# Patient Record
Sex: Female | Born: 1955
Health system: Southern US, Community
[De-identification: ages and names within clinical notes are randomized; demographics above are authoritative.]

## PROBLEM LIST (undated history)

## (undated) DIAGNOSIS — T7840XA Allergy, unspecified, initial encounter: Secondary | ICD-10-CM

## (undated) DIAGNOSIS — F32A Depression, unspecified: Secondary | ICD-10-CM

## (undated) DIAGNOSIS — M1990 Unspecified osteoarthritis, unspecified site: Secondary | ICD-10-CM

## (undated) DIAGNOSIS — F419 Anxiety disorder, unspecified: Secondary | ICD-10-CM

## (undated) DIAGNOSIS — E7800 Pure hypercholesterolemia, unspecified: Secondary | ICD-10-CM

## (undated) DIAGNOSIS — D649 Anemia, unspecified: Secondary | ICD-10-CM

## (undated) DIAGNOSIS — H905 Unspecified sensorineural hearing loss: Secondary | ICD-10-CM

## (undated) DIAGNOSIS — K219 Gastro-esophageal reflux disease without esophagitis: Secondary | ICD-10-CM

## (undated) DIAGNOSIS — E039 Hypothyroidism, unspecified: Secondary | ICD-10-CM

## (undated) DIAGNOSIS — R7303 Prediabetes: Secondary | ICD-10-CM

## (undated) HISTORY — PX: CHOLECYSTECTOMY: SHX55

## (undated) HISTORY — DX: Anemia, unspecified: D64.9

## (undated) HISTORY — DX: Hypothyroidism, unspecified: E03.9

## (undated) HISTORY — DX: Gastro-esophageal reflux disease without esophagitis: K21.9

## (undated) HISTORY — DX: Allergy, unspecified, initial encounter: T78.40XA

## (undated) HISTORY — DX: Anxiety disorder, unspecified: F41.9

## (undated) HISTORY — PX: ECTOPIC PREGNANCY SURGERY: SHX613

## (undated) HISTORY — DX: Pure hypercholesterolemia, unspecified: E78.00

## (undated) HISTORY — DX: Prediabetes: R73.03

## (undated) HISTORY — DX: Depression, unspecified: F32.A

---

## 2010-10-15 ENCOUNTER — Other Ambulatory Visit: Payer: Self-pay | Admitting: Internal Medicine

## 2010-10-15 ENCOUNTER — Inpatient Hospital Stay (HOSPITAL_COMMUNITY): Payer: 59

## 2010-10-15 ENCOUNTER — Encounter (INDEPENDENT_AMBULATORY_CARE_PROVIDER_SITE_OTHER): Payer: Self-pay | Admitting: General Surgery

## 2010-10-15 ENCOUNTER — Ambulatory Visit
Admission: RE | Admit: 2010-10-15 | Discharge: 2010-10-15 | Disposition: A | Discharge status: Home / Self Care | Payer: 59 | Source: Ambulatory Visit | Attending: Internal Medicine | Admitting: Internal Medicine

## 2010-10-15 ENCOUNTER — Ambulatory Visit (INDEPENDENT_AMBULATORY_CARE_PROVIDER_SITE_OTHER): Payer: 59 | Admitting: General Surgery

## 2010-10-15 ENCOUNTER — Inpatient Hospital Stay (HOSPITAL_COMMUNITY)
Admission: AD | Admit: 2010-10-15 | Discharge: 2010-10-19 | DRG: 419 | Disposition: A | Discharge status: Home / Self Care | Payer: 59 | Source: Ambulatory Visit | Attending: General Surgery | Admitting: General Surgery

## 2010-10-15 VITALS — BP 116/72 | HR 76 | Temp 98.6°F | Ht 67.0 in | Wt 155.8 lb

## 2010-10-15 DIAGNOSIS — R110 Nausea: Secondary | ICD-10-CM

## 2010-10-15 DIAGNOSIS — E8779 Other fluid overload: Secondary | ICD-10-CM | POA: Diagnosis not present

## 2010-10-15 DIAGNOSIS — R197 Diarrhea, unspecified: Secondary | ICD-10-CM

## 2010-10-15 DIAGNOSIS — R1011 Right upper quadrant pain: Secondary | ICD-10-CM

## 2010-10-15 DIAGNOSIS — R1031 Right lower quadrant pain: Secondary | ICD-10-CM

## 2010-10-15 DIAGNOSIS — F411 Generalized anxiety disorder: Secondary | ICD-10-CM | POA: Diagnosis present

## 2010-10-15 DIAGNOSIS — K810 Acute cholecystitis: Secondary | ICD-10-CM

## 2010-10-15 DIAGNOSIS — K8000 Calculus of gallbladder with acute cholecystitis without obstruction: Principal | ICD-10-CM | POA: Diagnosis present

## 2010-10-15 LAB — COMPREHENSIVE METABOLIC PANEL
Albumin: 3.1 g/dL — ABNORMAL LOW (ref 3.5–5.2)
BUN: 14 mg/dL (ref 6–23)
Calcium: 9.4 mg/dL (ref 8.4–10.5)
Creatinine, Ser: 0.86 mg/dL (ref 0.50–1.10)
Total Protein: 7.2 g/dL (ref 6.0–8.3)

## 2010-10-15 LAB — LIPASE, BLOOD: Lipase: 17 U/L (ref 11–59)

## 2010-10-15 LAB — AMYLASE: Amylase: 32 U/L (ref 0–105)

## 2010-10-15 MED ORDER — IOHEXOL 300 MG/ML  SOLN: 100.0000 mL | Freq: Once | INTRAMUSCULAR | Status: DC | PRN

## 2010-10-15 NOTE — Progress Notes (Signed)
This is a 55 year old female who has been discovered to have acute cholecystitis. She's been seen in the office and is going to be made a direct admission to [facility]. Plan is for IV antibiotics and cholecystectomy.

## 2010-10-16 ENCOUNTER — Inpatient Hospital Stay (HOSPITAL_COMMUNITY): Payer: 59

## 2010-10-16 ENCOUNTER — Other Ambulatory Visit (INDEPENDENT_AMBULATORY_CARE_PROVIDER_SITE_OTHER): Payer: Self-pay | Admitting: General Surgery

## 2010-10-16 DIAGNOSIS — K812 Acute cholecystitis with chronic cholecystitis: Secondary | ICD-10-CM

## 2010-10-16 LAB — BASIC METABOLIC PANEL
CO2: 28 mEq/L (ref 19–32)
Calcium: 8.9 mg/dL (ref 8.4–10.5)
Chloride: 97 mEq/L (ref 96–112)
Glucose, Bld: 132 mg/dL — ABNORMAL HIGH (ref 70–99)
Potassium: 3.6 mEq/L (ref 3.5–5.1)
Sodium: 134 mEq/L — ABNORMAL LOW (ref 135–145)

## 2010-10-16 LAB — CBC
HCT: 29.7 % — ABNORMAL LOW (ref 36.0–46.0)
Hemoglobin: 9.8 g/dL — ABNORMAL LOW (ref 12.0–15.0)
MCH: 30.6 pg (ref 26.0–34.0)
MCV: 92.8 fL (ref 78.0–100.0)
Platelets: 223 10*3/uL (ref 150–400)
RBC: 3.2 MIL/uL — ABNORMAL LOW (ref 3.87–5.11)
WBC: 12.1 10*3/uL — ABNORMAL HIGH (ref 4.0–10.5)

## 2010-10-16 LAB — ABO/RH: ABO/RH(D): A POS

## 2010-10-16 LAB — TYPE AND SCREEN

## 2010-10-17 ENCOUNTER — Inpatient Hospital Stay (HOSPITAL_COMMUNITY): Payer: 59

## 2010-10-17 DIAGNOSIS — J811 Chronic pulmonary edema: Secondary | ICD-10-CM

## 2010-10-19 NOTE — H&P (Signed)
[name], DANSER NO.:  [id number]  MEDICAL RECORD NO.:  [id number]  LOCATION:  1528                         FACILITY:  [facility]  PHYSICIAN:  Adolph Pollack, M.D.DATE OF BIRTH:  05/16/1955  DATE OF ADMISSION:  10/15/2010 DATE OF DISCHARGE:                             HISTORY & PHYSICAL   REASON FOR ADMISSION:  Acute cholecystitis.  HISTORY:  This is a 55 year old female who has been having intermittent upper abdominal discomfort, some nausea, and diarrhea for the past 2 weeks.  Monday, however, it got acutely worse and she had significant upper abdominal pain, specifically in the right upper quadrant.  Two days ago, she went to urgent care where her white count was 10,000 and it was felt she may have some diverticulitis.  She was given Cipro and Flagyl.  Past 48 hours, however, she has become worse with some nausea, anorexia, fever, and worsening right upper quadrant abdominal pain.  She was seen by Dr. Robert Bellow and noted to have white count of 17,000 today.  CT scan was performed demonstrating findings consistent with acute cholecystitis and she being admitted for that.  No jaundice.  PAST MEDICAL HISTORY: 1. Ectopic pregnancy. 2. Anxiety.  PREVIOUS OPERATIONS:  Laparoscopic excision of ectopic pregnancy.  ALLERGIES:  None.  MEDICATIONS:  Zoloft, Cipro, Flagyl.  SOCIAL HISTORY:  She is married, here with her husband.  She works at [employer].  She is a former smoker (30 years). Occasional alcohol use.  FAMILY HISTORY:  Notable for hypertension, diabetes.  Father has prostate cancer.  REVIEW OF SYSTEMS:  CARDIOVASCULAR:  No hypertension, heart disease. PULMONARY:  No pneumonia, asthma, TB.  GI:  No peptic ulcer disease, hepatitis.  GU:  No kidney stones, dysuria, hematuria.  ENDOCRINE:  No diabetes, hypercholesterolemia.  NEUROLOGIC:  No strokes or seizures. HEMATOLOGIC:  No bleeding disorders or blood clots.  PHYSICAL  EXAMINATION:  GENERAL:  An ill-appearing female, very pleasant. VITAL SIGNS:  Temperature is 98.6, heart rate 76, blood pressure 116/72. EYES:  Extraocular motions intact.  No icterus. NECK:  Supple without obvious mass. RESPIRATORY:  Breath sounds equal and clear, respirations unlabored. CARDIOVASCULAR:  Regular rate, regular rhythm.  No murmur.  No JVD. ABDOMEN:  Soft.  There is a tender fullness in the right upper quadrant with guarding present.  No hernia. MUSCULOSKELETAL:  Good muscle tone, range of motion. SKIN:  No jaundice.  LABORATORY DATA:  White cell count 17,000 today.  Complete metabolic profile is going to be ordered.  IMPRESSION:  Acute cholecystitis.  PLAN:  Admit to the hospital, start IV.  Obtain laboratory data.  Start IV antibiotics.  Plan laparoscopic possible open cholecystectomy this admission.  I have discussed the procedure rationale and risks as well as aftercare.  Risks include but are not limited to bleeding, infection, wound healing problems, anesthesia, bile leak, accidental injury to the bile duct/liver/intestine, and postcholecystectomy diarrhea.  She and her husband seems to understand, agree with the plan.     Adolph Pollack, M.D.     Kari Baars  D:  10/15/2010  T:  10/16/2010  Job:  [id number]  cc:   Jonita Albee, M.D. Fax:  [phone]  Electronically Signed by Avel Peace M.D. on 10/19/2010 09:40:26 AM

## 2010-10-20 NOTE — Op Note (Signed)
[name], [name] NO.:  [id number]  MEDICAL RECORD NO.:  [id number]  LOCATION:  1528                         FACILITY:  [facility]  PHYSICIAN:  Ollen Gross. Vernell Morgans, M.D. DATE OF BIRTH:  September 04, 1955  DATE OF PROCEDURE:  10/16/2010 DATE OF DISCHARGE:                              OPERATIVE REPORT   PREOPERATIVE DIAGNOSIS:  Cholecystitis with cholelithiasis.  POSTOPERATIVE DIAGNOSIS:  Cholecystitis with cholelithiasis.  PROCEDURE:  Laparoscopic cholecystectomy with intraoperative cholangiogram.  SURGEON:  Ollen Gross. Vernell Morgans, M.D.  ASSISTANT:  Letha Cape, PA  ANESTHESIA:  General endotracheal.  PROCEDURE:  After informed consent was obtained, the patient was brought to the operating room, placed in supine position on operating room table.  After induction of general anesthesia, the patient's eye was prepped with ChloraPrep, allowed to dry and draped in usual sterile manner.  The area below the umbilicus infiltrated with 0.25% Marcaine. A small incision was made with 15 blade knife.  This incision was carried down through the subcutaneous tissue bluntly with a hemostat and Army-Navy retractors until linea alba was identified.  The linea alba was incised with a 15 blade knife.  Each side was grasped with Kocher clamps and elevated anteriorly.  The preperitoneal space was then probed bluntly with hemostat until the peritoneum was opened access was gained to the abdominal cavity.  0 Vicryl pursestring stitch was placed in the fascia around the opening, Xylocaine was placed through the opening and anchored in place with previous placed Vicryl pursestring stitch.  The eye was then insufflated with carbon dioxide without difficulty.  The patient was placed in reverse Trendelenburg position, rotated with the right side up.  The laparoscope was inserted through the Hasson cannula and a large inflammatory gallbladder was identified in the right upper quadrant.  Next,  the epigastric region was infiltrated with 0.25% Marcaine.  A small incision was made with 15 blade knife and a 10-mm port was placed bluntly through this incision into the abdominal cavity under direct vision.  Sites were then chosen laterally on the right side and placement of two 5 mm ports.  Each of these areas infiltrated with 0.25% Marcaine.  Small stab incisions were made with a 15 blade knife and 5-mm ports were placed bluntly through these incisions into the abdominal cavity under direct vision.  30-degree scope was used.  There were a lot of adhesions of omentum and duodenum and colon to the gallbladder.  The gallbladder wall was partially necrotic.  We were able to actually sweep the duodenum, colon and omentum off the gallbladder without difficulty.  We then used suction aspirator and punctured the dome of the gallbladder and were able to aspirate its contents, so that we could grab the wall of the gallbladder.  Once this was accomplished, we are then able to place blunt grasper through the lateral-most 5-mm port, grasped the dome of gallbladder and elevated anteriorly and superiorly.  Another blunt grasper was placed through the other 5-mm port for retraction of body and neck of the gallbladder.  Dissector was placed through the epigastric port and using electrocautery, peritoneal reflection around the gallbladder neck was opened.  Blunt dissection was  then carried out in this area until the gallbladder neck cystic duct junction was readily identified and a good window was created.  A single clip was placed proximally on the gallbladder neck.  A small ductotomy was made just below the clip with a laparoscopic scissors.  14-gauge Angiocath was placed percutaneously through the anterior abdominal wall under direct vision.  A Reddick cholangiogram catheter was placed through the Angiocath and flushed.  The catheter was placed within the cystic duct and anchored in place with the  clip.  Cholangiogram was obtained that showed no filling defects, good emptying in duodenum and adequate length on the cystic duct.  The anchoring clip and catheters were then removed from the patient.  Three clips placed proximally in the cystic duct and the ducts divided between two sets of clips. Posteriorly, cystic artery was identified and dissected circumferentially until a good window was created.  Two clips placed proximally and one distally in the artery and the artery was divided between the two.  The gallbladder was very inflamed and attachment of the gallbladder was very thick.  Using a hook cautery, the gallbladder was separated from liver bed.  After detaching it from liver bed, a laparoscopic bag was inserted through the epigastric port.  The gallbladder was placed in the bag and bag was sealed.  The abdomen was then irrigated with copious amounts of saline until the effluent was clear.  The liver bed was inspected again and several bleeding points coagulated with electrocautery until the area was completely hemostatic. A blunt grasper was then placed through the lateral-most 5-mm port and up through the epigastric port.  This was used to bring a 19-French round Blake drain into the abdominal cavity.  The drain was placed in the right gutter and under the liver in the gallbladder bed, the drain was anchored to the skin with a 3-0 nylon stitch.  Next, the laparoscope was moved to the epigastric port.  A gallbladder grasper was placed through the Hasson cannula used to grasp the bag.  The bag with the gallbladder and the [facility] cannula were removed together, though the gallbladder did have a large stone in it.  We tried opening the fascia just a little bit superiorly but we were still not able to get the gallbladder out.  We then opened the gallbladder and placed a ring forceps in the gallbladder and extracted a very large stone, probably about at least 3 cm in size.  Once  the stone was removed and we were able to remove the rest of gallbladder without difficulty, the fascial defect was closed with previous placed Vicryl pursestring stitch as well as with two other figure-of-eight 0 Vicryl stitches.  The rest of ports were removed under direct vision were found to be hemostatic.  The gas was allowed to escape.  The skin incisions were all closed with interrupted 4-0 Monocryl subcuticular stitches.  Dermabond and dressings were applied.  The patient tolerated procedure well.  At the end of the case, all needle, sponge, instrument counts were correct.  Drain was placed to bulb suction.  There was good seal.  The patient was then awakened, taken to recovery room in stable condition.     Ollen Gross. Vernell Morgans, M.D.     PST/MEDQ  D:  10/16/2010  T:  10/16/2010  Job:  [id number]  Electronically Signed by Chevis Pretty III M.D. on 10/20/2010 03:50:57 AM

## 2010-10-22 ENCOUNTER — Encounter (INDEPENDENT_AMBULATORY_CARE_PROVIDER_SITE_OTHER): Payer: Self-pay | Admitting: General Surgery

## 2010-10-22 ENCOUNTER — Ambulatory Visit (INDEPENDENT_AMBULATORY_CARE_PROVIDER_SITE_OTHER): Payer: Self-pay | Admitting: General Surgery

## 2010-10-22 DIAGNOSIS — K8010 Calculus of gallbladder with chronic cholecystitis without obstruction: Secondary | ICD-10-CM

## 2010-10-22 NOTE — Progress Notes (Signed)
Subjective:     Patient ID: [name], female   DOB: 1955/11/05, 55 y.o.   MRN: [id number]  HPI The patient is a 55 year old white female who is now about a week out from a laparoscopic cholecystectomy for cholecystitis with cholelithiasis. Her gallbladder was partially necrotic. We did leave a drain in place. The drain has been draining only a small amount of serous fluid. Her appetite has been good. She still has some soreness in the right upper quadrant. Review of Systems     Objective:   Physical Exam On exam her abdomen is soft. She has some mild right upper quadrant soreness. She also has some slight redness around the infraumbilical incision although she has been on Augmentin at home. The drain was removed without difficulty. A sterile dressing was applied.    Assessment:     Postop from a laparoscopic cholecystectomy    Plan:     The drain was removed today without difficulty. We'll plan to see her back in another week to check her progress.

## 2010-10-22 NOTE — Patient Instructions (Signed)
May shower  No heavy lifting F/U in 1 week

## 2010-11-02 ENCOUNTER — Ambulatory Visit (INDEPENDENT_AMBULATORY_CARE_PROVIDER_SITE_OTHER): Payer: 59 | Admitting: General Surgery

## 2010-11-02 ENCOUNTER — Encounter (INDEPENDENT_AMBULATORY_CARE_PROVIDER_SITE_OTHER): Payer: Self-pay | Admitting: General Surgery

## 2010-11-02 DIAGNOSIS — K8010 Calculus of gallbladder with chronic cholecystitis without obstruction: Secondary | ICD-10-CM

## 2010-11-02 NOTE — Patient Instructions (Signed)
Try to refrain from strenuous activity for 6 weeks from day of surgery

## 2010-11-04 NOTE — Discharge Summary (Signed)
NAMEELLIOTTE, Amy NO.:  [id number]  MEDICAL RECORD NO.:  [id number]  LOCATION:  1528                         FACILITY:  [facility]  PHYSICIAN:  Anselm Pancoast. Graclyn Lawther, M.D.DATE OF BIRTH:  [birth year]  DATE OF ADMISSION:  10/15/2010 DATE OF DISCHARGE:  10/19/2010                              DISCHARGE SUMMARY   ADMISSION DIAGNOSIS:  Acute cholecystitis.  DISCHARGE DIAGNOSES: 1. Acute cholecystitis. 2. Fluid overload. 3. History of ectopic pregnancy. 4. History of anxiety.  PROCEDURES:  Laparoscopic cholecystectomy with intraoperative cholangiogram, October 16, 2010, Dr. Carolynne Edouard.  BRIEF HISTORY:  The patient is a 55 year old female with intermittent abdominal discomfort, nausea and diarrhea for 2 weeks.  It became acutely worse abdominal pain 2 days ago.  She was seen in Urgent Care where white count was 10,000 and she was treated for diverticulitis with Cipro and Flagyl.  Over the past 48 hours, she has had worsening of symptoms including nausea, anorexia, fever and worsening right upper quadrant pain.  Her white count today was up to 17,000 and CT was performed which was consistent with acute cholecystitis.  She was admitted by Dr. Avel Peace after discussions with Dr. Robert Bellow for initial workup.  PAST MEDICAL HISTORY: 1. Ectopic pregnancy. 2. Anxiety. 3. History of laparoscopic excision of ectopic pregnancy.  ALLERGIES:  None.  MEDICATIONS ON ADMISSION:  Zoloft, Cipro and Flagyl.  SOCIAL HISTORY:  She is married.  Former smoker.  For further history and physical, please see the dictated note.  HOSPITAL COURSE:  The patient was admitted.  The following day underwent laparoscopic cholecystectomy.  She tolerated this well. Returned to the floor in satisfactory condition.  First postoperative day, sats were 92% on 3 L.  Chest x-ray was obtained which showed some small effusions, left greater than the right, question of some fluid overload.  Her IV  fluids were discontinued.  She was maintained on IV antibiotics and mobilized.  Over the last 48 hours, she has done better, although her initial sats were somewhat diminished.  She has been placed on Lasix.  This has improved.  Today on October 19, 2010, she was seen by Dr. Zachery Dakins. Her lungs are clear.  Abdomen was soft, nontender.  She is tolerating diet, having bowel movements.  His opinion is she will be discharged home.  Plan to leave the drain in until October 22, 2010, at which time it will be removed in the office by Dr. Carolynne Edouard at 10 a.m..  DISCHARGE MEDICATIONS: 1. Continue on Augmentin 8/75 mg one p.o. b.i.d. for 5 days. 2. Tylenol p.r.n. for pain. 3. Oxycodone IR 1/2 to 2 tablets q.4 p.r.n. 4. She can take Metamucil one pack daily as needed for constipation. 5. She can resume her calcium carbonate with multivitamin 1 daily. 6. Fish oil 1 daily. 7. Zoloft 25 mg daily.  FOLLOWUP:  She will follow up with Dr. Carolynne Edouard on October 22, 2010, for drain removal.  CONDITION ON DISCHARGE:  Improved.     Eber Hong, P.A.   ______________________________ Anselm Pancoast. Zachery Dakins, M.D.    WDJ/MEDQ  D:  10/19/2010  T:  10/19/2010  Job:  [id number]  cc:   Ashley Jacobs.  Guest, M.D. Fax: [phone]  Electronically Signed by Sherrie George P.A. on 10/20/2010 03:19:59 PM Electronically Signed by Consuello Bossier M.D. on 11/04/2010 09:18:55 AM

## 2010-11-05 NOTE — Progress Notes (Signed)
Subjective:     Patient ID: Dustin Burrill, female   DOB: November 16, 1955, 55 y.o.   MRN: [id number]  HPI The patient is a 55 year old white female who is now about 3 weeks out from a laparoscopic cholecystectomy for cholecystitis with cholelithiasis. She had her drain removed a couple weeks ago. She seems to be doing well but occasionally get some abdominal soreness and some shooting pains. These usually do not last very long. She denies any nausea or vomiting. Her bowels are working normally. She denies any fevers or chills. Review of Systems     Objective:   Physical Exam On exam her abdomen is soft with minimal tenderness. Her incisions are healing nicely.    Assessment:     Postop from a laparoscopic cholecystectomy.    Plan:     At this point I would like her to try not to overdo it with activity. We will plan to see her back in another 3 or 4 weeks to check her progress. She agrees to call if she has any problems in the meantime.

## 2010-12-17 ENCOUNTER — Encounter (INDEPENDENT_AMBULATORY_CARE_PROVIDER_SITE_OTHER): Payer: 59 | Admitting: General Surgery

## 2011-07-23 ENCOUNTER — Ambulatory Visit (INDEPENDENT_AMBULATORY_CARE_PROVIDER_SITE_OTHER): Payer: 59 | Admitting: Family Medicine

## 2011-07-23 VITALS — BP 132/78 | HR 84 | Temp 99.0°F | Resp 16 | Ht 66.5 in | Wt 159.0 lb

## 2011-07-23 DIAGNOSIS — N390 Urinary tract infection, site not specified: Secondary | ICD-10-CM

## 2011-07-23 DIAGNOSIS — R350 Frequency of micturition: Secondary | ICD-10-CM

## 2011-07-23 LAB — POCT URINALYSIS DIPSTICK
Bilirubin, UA: NEGATIVE
Glucose, UA: NEGATIVE
Nitrite, UA: NEGATIVE
Spec Grav, UA: 1.01
Urobilinogen, UA: 0.2

## 2011-07-23 LAB — POCT UA - MICROSCOPIC ONLY
Bacteria, U Microscopic: NEGATIVE
Casts, Ur, LPF, POC: NEGATIVE
Mucus, UA: NEGATIVE

## 2011-07-23 MED ORDER — NITROFURANTOIN MONOHYD MACRO 100 MG PO CAPS: 100.0000 mg | ORAL_CAPSULE | Freq: Two times a day (BID) | ORAL | Status: AC

## 2011-07-23 NOTE — Progress Notes (Signed)
[facility]:  Office Visit  Chief Complaint:  Chief Complaint  Patient presents with  . Urinary Tract Infection    frequency & discomfort 3 days    HPI: [name] is a 56 y.o. female who complains of  3 day history of increase urine frequency, abdominal bloating, recent kayaking. Holds urine. No fevers, chills, N/V. abd  Or pelvic pain. Gets UTI q2 years, last one grew out E. Coli. She has not tried anything OTC for this.   Past Medical History  Diagnosis Date  . Tubal pregnancy   . GERD (gastroesophageal reflux disease)    Past Surgical History  Procedure Date  . Ectopic pregnancy surgery   . Cholecystectomy    History   Social History  . Marital Status: Married    Spouse Name: N/A    Number of Children: N/A  . Years of Education: N/A   Social History Main Topics  . Smoking status: Former Games developer  . Smokeless tobacco: None  . Alcohol Use: Yes  . Drug Use: No  . Sexually Active: None   Other Topics Concern  . None   Social History Narrative  . None   Family History  Problem Relation Age of Onset  . Prostate cancer Father   . Diabetes Father   . Hypertension Mother   . Hyperlipidemia Mother    Allergies  Allergen Reactions  . Amoxicillin-Pot Clavulanate Rash   Prior to Admission medications   Medication Sig Start Date End Date Taking? Authorizing Provider  Calcium Carbonate-Vitamin D (CALCIUM-VITAMIN D) 500-200 MG-UNIT per tablet Take 1 tablet by mouth 2 (two) times daily with a meal. Not sure of dosage.   Yes Historical Provider, MD  fish oil-omega-3 fatty acids 1000 MG capsule Take 1 g by mouth daily. Not consistant   Yes Historical Provider, MD     ROS: The patient denies fevers, chills, night sweats, unintentional weight loss, chest pain, palpitations, wheezing, dyspnea on exertion, nausea, vomiting, abdominal pain, dysuria, hematuria, melena, numbness, weakness, or tingling. + dysuria  All other systems have been reviewed and  were otherwise negative with the exception of those mentioned in the HPI and as above.    PHYSICAL EXAM: Filed Vitals:   07/23/11 1406  BP: 132/78  Pulse: 84  Temp: 99 F (37.2 C)  Resp: 16   Filed Vitals:   07/23/11 1406  Height: 5' 6.5" (1.689 m)  Weight: 159 lb (72.122 kg)   Body mass index is 25.28 kg/(m^2).  General: Alert, no acute distress HEENT:  Normocephalic, atraumatic, oropharynx patent.  Cardiovascular:  Regular rate and rhythm, no rubs murmurs or gallops.  No Carotid bruits, radial pulse intact. No pedal edema.  Respiratory: Clear to auscultation bilaterally.  No wheezes, rales, or rhonchi.  No cyanosis, no use of accessory musculature GI: No organomegaly, abdomen is soft and non-tender, positive bowel sounds.  No masses. Skin: No rashes. Neurologic: Facial musculature symmetric. Psychiatric: Patient is appropriate throughout our interaction. Lymphatic: No cervical lymphadenopathy Musculoskeletal: Gait intact. No CVA tenderness   LABS: Results for orders placed in visit on 07/23/11  POCT UA - MICROSCOPIC ONLY      Component Value Range   WBC, Ur, HPF, POC 1-2     RBC, urine, microscopic 1-2     Bacteria, U Microscopic negative     Mucus, UA negative     Epithelial cells, urine per micros 0-1     Crystals, Ur, HPF, POC negative     Casts,  Ur, LPF, POC negative     Yeast, UA negative    POCT URINALYSIS DIPSTICK      Component Value Range   Color, UA yellow     Clarity, UA clear     Glucose, UA negative     Bilirubin, UA negative     Ketones, UA negative     Spec Grav, UA 1.010     Blood, UA small     pH, UA 7.0     Protein, UA negative     Urobilinogen, UA 0.2     Nitrite, UA negative     Leukocytes, UA small (1+)       EKG/XRAY:   Primary read interpreted by Dr. Conley Rolls at [facility].   ASSESSMENT/PLAN: Encounter Diagnoses  Name Primary?  . Urinary frequency Yes  . UTI (lower urinary tract infection)    Rx Macrobid based on prior cx  sensitivity Urine cx pending Increase fluids Do not hold urine. UTI precautions given.     [name], DO 07/23/2011 2:42 PM

## 2011-07-25 LAB — URINE CULTURE: Colony Count: 4000

## 2011-07-26 ENCOUNTER — Encounter: Payer: Self-pay | Admitting: Family Medicine

## 2011-08-02 ENCOUNTER — Telehealth: Payer: Self-pay | Admitting: *Deleted

## 2011-08-02 ENCOUNTER — Ambulatory Visit: Payer: 59

## 2011-08-02 ENCOUNTER — Ambulatory Visit (INDEPENDENT_AMBULATORY_CARE_PROVIDER_SITE_OTHER): Payer: 59 | Admitting: Physician Assistant

## 2011-08-02 VITALS — BP 154/83 | HR 72 | Temp 98.2°F | Resp 16 | Ht 67.5 in | Wt 166.6 lb

## 2011-08-02 DIAGNOSIS — R350 Frequency of micturition: Secondary | ICD-10-CM

## 2011-08-02 LAB — POCT UA - MICROSCOPIC ONLY
Casts, Ur, LPF, POC: NEGATIVE
Crystals, Ur, HPF, POC: NEGATIVE
Yeast, UA: NEGATIVE

## 2011-08-02 LAB — IRON AND TIBC
Iron: 85 ug/dL (ref 42–145)
UIBC: 288 ug/dL (ref 125–400)

## 2011-08-02 LAB — POCT URINALYSIS DIPSTICK
Glucose, UA: NEGATIVE
Leukocytes, UA: NEGATIVE
Nitrite, UA: NEGATIVE
Urobilinogen, UA: 0.2
pH, UA: 5.5

## 2011-08-02 LAB — POCT CBC
HCT, POC: 35.6 % — AB (ref 37.7–47.9)
Hemoglobin: 11.2 g/dL — AB (ref 12.2–16.2)
Lymph, poc: 1.9 (ref 0.6–3.4)
MCH, POC: 29.6 pg (ref 27–31.2)
MCHC: 31.5 g/dL — AB (ref 31.8–35.4)
MCV: 94.2 fL (ref 80–97)
WBC: 4.8 10*3/uL (ref 4.6–10.2)

## 2011-08-02 LAB — POCT SEDIMENTATION RATE: POCT SED RATE: 10 mm/hr (ref 0–22)

## 2011-08-02 LAB — POCT WET PREP WITH KOH: Clue Cells Wet Prep HPF POC: NEGATIVE

## 2011-08-02 NOTE — Telephone Encounter (Signed)
LMOM TO CB  PLEASE GIVE THIS PT THIS INFORMATION  SHE CAN CALL [facility] FOR MAMMOGRAM  RECOMMENDED GYN  DR. Zelphia Cairo  [phone]   DR. LAVOIE, DR. MODY, DR [facility] [phone]

## 2011-08-02 NOTE — Progress Notes (Signed)
Subjective:    Patient ID: [name], female    DOB: 06-09-1955, 56 y.o.   MRN: [id number]  HPI See previous office visit from 07/23/11.  [name] returns today with no change in her abdominal discomfort.  She completed macrobid treatment but still is experiencing lower abdominal discomfort, bloating, lower back pain, and occasional epigastric pain.  She has been more nauseated this week with feeling feverish.  No vomiting and has had fairly normal BM for her. She notes some urinary urgency and pressure but no true dysuria.  She is sexually active with the same partner.  No dyspareunia, vaginal discharge, itching or burning.  She is menopausal and has had no episodes of bleeding.  She has not had GYN exam in 2 years due to her GYN retiring but states she has always had normal pap smears and rarely missed an annual exam.   [name] is tearful today and when asked, states she is fearful she has a mass of some sort.  She is healthy other than episodes of depression, treated with Zoloft until 6 months ago when she felt she was doing well and could stop. She does not smoke and is very active.  She enjoys her job and says it is intense but not stressful. Her father has DM and mother HTN. She had cholecystectomy and ?right oophorectomy from ectopic pregnancy.   She needs MMG and has never had colonoscopy  Review of Systems As above    Objective:   Physical Exam  Constitutional: She is oriented to person, place, and time. She appears well-developed and well-nourished. She does not appear ill.  Eyes: No scleral icterus.  Cardiovascular: Normal rate and regular rhythm.   Pulmonary/Chest: Effort normal and breath sounds normal.  Abdominal: Soft. Normal appearance and bowel sounds are normal. She exhibits no mass. There is no hepatosplenomegaly. There is tenderness in the right lower quadrant, epigastric area, suprapubic area and left lower quadrant. There is no rebound and no CVA tenderness.    Genitourinary: Vagina normal and uterus normal. Cervix exhibits no motion tenderness, no discharge and no friability. Right adnexum displays tenderness. Right adnexum displays no mass and no fullness. Left adnexum displays tenderness. Left adnexum displays no mass and no fullness.  Neurological: She is alert and oriented to person, place, and time.  Skin: Skin is warm.  Psychiatric: She has a normal mood and affect.      Results for orders placed in visit on 08/02/11  POCT URINALYSIS DIPSTICK      Component Value Range   Color, UA yellow     Clarity, UA clear     Glucose, UA neg     Bilirubin, UA neg     Ketones, UA neg     Spec Grav, UA 1.010     Blood, UA small     pH, UA 5.5     Protein, UA neg     Urobilinogen, UA 0.2     Nitrite, UA neg     Leukocytes, UA Negative    POCT UA - MICROSCOPIC ONLY      Component Value Range   WBC, Ur, HPF, POC 0-2     RBC, urine, microscopic 1-3     Bacteria, U Microscopic neg     Mucus, UA neg     Epithelial cells, urine per micros 0-1     Crystals, Ur, HPF, POC neg     Casts, Ur, LPF, POC neg     Yeast, UA neg  POCT CBC      Component Value Range   WBC 4.8  4.6 - 10.2 (K/uL)   Lymph, poc 1.9  0.6 - 3.4    POC LYMPH PERCENT 39.5  10 - 50 (%L)   MID (cbc) 0.4  0 - 0.9    POC MID % 7.6  0 - 12 (%M)   POC Granulocyte 2.5  2 - 6.9    Granulocyte percent 52.9  37 - 80 (%G)   RBC 3.78 (*) 4.04 - 5.48 (M/uL)   Hemoglobin 11.2 (*) 12.2 - 16.2 (g/dL)   HCT, POC 40.9 (*) 81.1 - 47.9 (%)   MCV 94.2  80 - 97 (fL)   MCH, POC 29.6  27 - 31.2 (pg)   MCHC 31.5 (*) 31.8 - 35.4 (g/dL)   RDW, POC 91.4     Platelet Count, POC 234  142 - 424 (K/uL)   MPV 11.6  0 - 99.8 (fL)  POCT WET PREP WITH KOH      Component Value Range   Trichomonas, UA Negative     Clue Cells Wet Prep HPF POC neg     Epithelial Wet Prep HPF POC 2-4     Yeast Wet Prep HPF POC neg     Bacteria Wet Prep HPF POC small     RBC Wet Prep HPF POC 0-2     WBC Wet Prep HPF POC  5-8     KOH Prep POC Negative     [facility] reading (PRIMARY) by  Dr. Merla Riches.  Abdomen 2 view FOS. Clips from cholecystectomy       Assessment & Plan:  Abdominal pain, likely from constipation Mild decrease HGB  Encourage Miralax 1-2 times/day.  Colace if needed.  Push fluids.  If symptoms do not resolve, consider pelvic US, CT and/or Colonoscopy. Send CMET, genprobe, FE/TIBC Sed Rate pending Discussed with Dr. Merla Riches who will see her in follow up while I'm out of town this week.  Needs MMG and GYN.  Names provided.

## 2011-08-02 NOTE — Patient Instructions (Signed)
Use Miralax as directed.  You can use it a maximum of 4 times a day but should start with only 1 - 2 times a day.  You can add Colace (stool softener). Add any form of fiber to your diet. Drink plenty of water. If you need follow up this week while I'm gone, see Dr. Merla Riches, who reviewed your case with me.  Call me Friday and let me know how you are.

## 2011-08-03 LAB — COMPREHENSIVE METABOLIC PANEL
ALT: 12 U/L (ref 0–35)
BUN: 18 mg/dL (ref 6–23)
CO2: 25 mEq/L (ref 19–32)
Calcium: 9.9 mg/dL (ref 8.4–10.5)
Chloride: 109 mEq/L (ref 96–112)
Creat: 0.81 mg/dL (ref 0.50–1.10)
Glucose, Bld: 112 mg/dL — ABNORMAL HIGH (ref 70–99)

## 2011-08-03 LAB — GC/CHLAMYDIA PROBE AMP, GENITAL: Chlamydia, DNA Probe: NEGATIVE

## 2011-08-04 NOTE — Telephone Encounter (Signed)
LMOM (OK per HIPPA) w/info about mammogram and recommended GYN. Asked for CB w/further ?s.

## 2011-08-14 ENCOUNTER — Telehealth: Payer: Self-pay

## 2011-08-14 MED ORDER — FLUTICASONE PROPIONATE 50 MCG/ACT NA SUSP: 2.0000 | Freq: Every day | NASAL | Status: DC

## 2011-08-14 MED ORDER — SERTRALINE HCL 50 MG PO TABS: 50.0000 mg | ORAL_TABLET | Freq: Every day | ORAL | Status: DC

## 2011-08-14 NOTE — Telephone Encounter (Signed)
Pt states alicia told her to call for Flonaise and 50 mg zoloft - they have not been previously filled here or at the pharmacy according to pt  Karin Golden at [facility]

## 2011-08-14 NOTE — Telephone Encounter (Signed)
Flonase and zoloft prescriptions sent to her pharmacy

## 2011-08-14 NOTE — Telephone Encounter (Signed)
Called pt LMOM rxs sent to pharmacy

## 2012-01-02 ENCOUNTER — Other Ambulatory Visit: Payer: Self-pay | Admitting: Physician Assistant

## 2012-08-10 ENCOUNTER — Ambulatory Visit: Payer: 59

## 2012-08-10 ENCOUNTER — Ambulatory Visit (INDEPENDENT_AMBULATORY_CARE_PROVIDER_SITE_OTHER): Payer: 59 | Admitting: Emergency Medicine

## 2012-08-10 VITALS — BP 112/84 | HR 82 | Temp 98.6°F | Resp 16 | Ht 67.5 in | Wt 162.0 lb

## 2012-08-10 DIAGNOSIS — K5900 Constipation, unspecified: Secondary | ICD-10-CM

## 2012-08-10 DIAGNOSIS — Z1211 Encounter for screening for malignant neoplasm of colon: Secondary | ICD-10-CM

## 2012-08-10 DIAGNOSIS — R143 Flatulence: Secondary | ICD-10-CM

## 2012-08-10 DIAGNOSIS — R140 Abdominal distension (gaseous): Secondary | ICD-10-CM

## 2012-08-10 DIAGNOSIS — R197 Diarrhea, unspecified: Secondary | ICD-10-CM

## 2012-08-10 DIAGNOSIS — R439 Unspecified disturbances of smell and taste: Secondary | ICD-10-CM

## 2012-08-10 DIAGNOSIS — R432 Parageusia: Secondary | ICD-10-CM

## 2012-08-10 LAB — COMPREHENSIVE METABOLIC PANEL
AST: 15 U/L (ref 0–37)
Albumin: 4.9 g/dL (ref 3.5–5.2)
BUN: 17 mg/dL (ref 6–23)
Calcium: 10 mg/dL (ref 8.4–10.5)
Chloride: 100 mEq/L (ref 96–112)
Glucose, Bld: 95 mg/dL (ref 70–99)
Potassium: 4.4 mEq/L (ref 3.5–5.3)
Total Protein: 7.2 g/dL (ref 6.0–8.3)

## 2012-08-10 LAB — POCT URINALYSIS DIPSTICK
Bilirubin, UA: NEGATIVE
Glucose, UA: NEGATIVE
Ketones, UA: NEGATIVE
Spec Grav, UA: 1.01

## 2012-08-10 LAB — POCT CBC
Hemoglobin: 13.2 g/dL (ref 12.2–16.2)
MCH, POC: 30.5 pg (ref 27–31.2)
MPV: 11 fL (ref 0–99.8)
POC Granulocyte: 2.9 (ref 2–6.9)
POC MID %: 6.3 %M (ref 0–12)
RBC: 4.33 M/uL (ref 4.04–5.48)
WBC: 5 10*3/uL (ref 4.6–10.2)

## 2012-08-10 LAB — POCT UA - MICROSCOPIC ONLY

## 2012-08-10 NOTE — Progress Notes (Signed)
Subjective:    Patient ID: [name], female    DOB: [birth year], 57 y.o.   MRN: [id number]  HPI  This 57 y.o. female presents for evaluation of several concerns. Began with a weird taste in her mouth.  No matter when she brushes, gargles, eats.  Present since before 5/09.  Chalky taste.  Constant. Symptoms began in [place], when traveling to her daughter's college graduation. Had eaten more greasy food than usual. Hiccups, belching and gas are intermittent, but occur daily. Bloating. Diarrhea comes and goes.  Explosive, can hardly get to the bathroom.  First episode 5/09, late at night, and lasted x hours.  2 other episodes, lasting 20 minutes.  In between these episodes, she's had stools that are less formed than previously.  Abdominal discomfort is worse in the upper abdomen, the bloating is all over. Some low back pain, and thought she may have a UTI.  Contacted her OBGYN, who called in [facility] 07/31/2012.  Notes a post nasal drip intermittently.  Usually feels a lot worse with a sinus infection.  Subjective chills, no fever. But notes, "I'm always cold."  No nausea or vomiting.  Other than the low back pain, no unexplained myalgias/arthralgias.  Back pain seems worse with increased bloating and need to defecate.  She has not had a colonoscopy.  Review of Systems As above.    Objective:   Physical Exam Blood pressure 112/84, pulse 82, temperature 98.6 F (37 C), temperature source Oral, resp. rate 16, height 5' 7.5" (1.715 m), weight 162 lb (73.483 kg), SpO2 97.00%. Body mass index is 24.98 kg/(m^2). Well-developed, well nourished WF who is awake, alert and oriented, in NAD. HEENT: [place]/AT, sclera and conjunctiva are clear.  EAC are patent, TMs are normal in appearance. Nasal mucosa is pink and moist. OP is clear. Neck: supple, non-tender, no lymphadenopathy, thyromegaly. Heart: RRR, no murmur Lungs: normal effort, CTA Abdomen: normo-active bowel sounds, supple, no mass or organomegaly.  Mild generalized discomfort, "pressure" with palpation of the abdomen, worst in the upper quadrants. Extremities: no cyanosis, clubbing or edema. Skin: warm and dry without rash. Psychologic: good mood and appropriate affect, normal speech and behavior.    Results for orders placed in visit on 08/10/12  POCT CBC      Result Value Range   WBC 5.0  4.6 - 10.2 K/uL   Lymph, poc 1.7  0.6 - 3.4   POC LYMPH PERCENT 34.8  10 - 50 %L   MID (cbc) 0.3  0 - 0.9   POC MID % 6.3  0 - 12 %M   POC Granulocyte 2.9  2 - 6.9   Granulocyte percent 58.9  37 - 80 %G   RBC 4.33  4.04 - 5.48 M/uL   Hemoglobin 13.2  12.2 - 16.2 g/dL   HCT, POC 78.2  95.6 - 47.9 %   MCV 99.2 (*) 80 - 97 fL   MCH, POC 30.5  27 - 31.2 pg   MCHC 30.7 (*) 31.8 - 35.4 g/dL   RDW, POC 21.3     Platelet Count, POC 260  142 - 424 K/uL   MPV 11.0  0 - 99.8 fL  POCT UA - MICROSCOPIC ONLY      Result Value Range   WBC, Ur, HPF, POC 4-21     RBC, urine, microscopic 0-8     Bacteria, U Microscopic 2+     Mucus, UA TRACE     Epithelial cells, urine per micros 16-29  Crystals, Ur, HPF, POC NEG     Casts, Ur, LPF, POC NEG     Yeast, UA NEG    POCT URINALYSIS DIPSTICK      Result Value Range   Color, UA YELLOW     Clarity, UA CLOUDY     Glucose, UA NEG     Bilirubin, UA NEG     Ketones, UA NEG     Spec Grav, UA 1.010     Blood, UA TRACE-LYSED     pH, UA 7.0     Protein, UA NEG     Urobilinogen, UA 0.2     Nitrite, UA NEG     Leukocytes, UA Trace     ACUTE ABDOMINAL SERIES: [facility] reading (PRIMARY) by  Dr. Cleta Alberts.  Large stool throughout the colon.  No obstruction evident.  No free air.  No masses.  Clips in the RUQ are consistent with cholecystectomy.      Assessment & Plan:  Taste sense altered  Bloating - OTC simethicone-containing product.  Diarrhea - Plan: POCT CBC, POCT UA - Microscopic Only, POCT urinalysis dipstick, Comprehensive metabolic panel, Clostridium difficile EIA, Ova and parasite screen, DG Abd  Acute W/Chest, LIKELY DUE TO CONSTIPATION.  Constipation - Plan: clean out with Miralax (10 doses in 64 ounces of sports drink). Increase fluids, fiber and physical activity.  Screening for colon cancer - Plan: Ambulatory referral to Gastroenterology  Fernande Bras, PA-C Physician Assistant-Certified [facility]

## 2012-08-10 NOTE — Patient Instructions (Addendum)
Use Miralax, 10 doses in 64 ounces of sports drink to help "clean out" the large amount of stool currently in the colon.   A simethicone-containing product can help with the bloating.  To help reduce constipation and promote bowel health, 1. Drink at least 64 ounces of water each day; 2. Eat plenty of fiber (fruits, vegetables, whole grains, legumes) 3. Get plenty of physical activity  If needed, use a stool softener (docusate) or an osmotic laxative (like Miralax) each day, or as needed.  I will contact you with your lab results as soon as they are available.   If you have not heard from me in 2 weeks, please contact me.  The fastest way to get your results is to register for My Chart (see the instructions on the last page of this printout).

## 2012-08-11 ENCOUNTER — Encounter: Payer: Self-pay | Admitting: Gastroenterology

## 2012-08-15 LAB — OVA AND PARASITE SCREEN: OP: NONE SEEN

## 2012-08-23 ENCOUNTER — Ambulatory Visit: Payer: 59 | Admitting: Gastroenterology

## 2012-10-06 ENCOUNTER — Ambulatory Visit (INDEPENDENT_AMBULATORY_CARE_PROVIDER_SITE_OTHER): Payer: 59 | Admitting: Internal Medicine

## 2012-10-06 ENCOUNTER — Encounter: Payer: Self-pay | Admitting: Internal Medicine

## 2012-10-06 ENCOUNTER — Other Ambulatory Visit (INDEPENDENT_AMBULATORY_CARE_PROVIDER_SITE_OTHER): Payer: 59

## 2012-10-06 VITALS — BP 102/60 | HR 72 | Ht 67.0 in | Wt 169.2 lb

## 2012-10-06 DIAGNOSIS — K589 Irritable bowel syndrome without diarrhea: Secondary | ICD-10-CM

## 2012-10-06 MED ORDER — OMEPRAZOLE-SODIUM BICARBONATE 40-1100 MG PO CAPS: 1.0000 | ORAL_CAPSULE | Freq: Every day | ORAL | Status: DC

## 2012-10-06 MED ORDER — DICYCLOMINE HCL 20 MG PO TABS: 20.0000 mg | ORAL_TABLET | Freq: Two times a day (BID) | ORAL | Status: DC

## 2012-10-06 MED ORDER — MOVIPREP 100 G PO SOLR: 1.0000 | Freq: Once | ORAL | Status: DC

## 2012-10-06 NOTE — Patient Instructions (Addendum)
You have been scheduled for a colonoscopy with propofol. Please follow written instructions given to you at your visit today.  Please pick up your prep kit at the pharmacy within the next 1-3 days. If you use inhalers (even only as needed), please bring them with you on the day of your procedure. Your physician has requested that you go to www.startemmi.com and enter the access code given to you at your visit today. This web site gives a general overview about your procedure. However, you should still follow specific instructions given to you by our office regarding your preparation for the procedure.  We have sent the following medications to your pharmacy for you to pick up at your convenience: Bentyl  Please purchase the following medications over the counter and take as directed: Benefiber Zegerid  Your physician has requested that you go to the basement for the following lab work before leaving today: TSH, Celiac Panel  CC: Dr Robert Bellow

## 2012-10-06 NOTE — Progress Notes (Signed)
[name] Apr 03, 1955 MRN [id number]  History of Present Illness:  This is a 57 year old white female with a one-year history of bloating, urgent bowel movements and diarrhea alternating with constipation. 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 She denies rectal bleeding or nocturnal diarrhea. She feels uncomfortable most of the time. Patient underwent an emergency laparoscopic cholecystectomy in August 2012 and after that developed diarrhea. There is no family history of inflammatory bowel disease or colon cancer. Her mother had diverticulitis. Recent stool studies including C.Diff and O&P were negative.   Past Medical History  Diagnosis Date  . Tubal pregnancy   . GERD (gastroesophageal reflux disease)     resolved with cholecystectomy   Past Surgical History  Procedure Laterality Date  . Ectopic pregnancy surgery    . Cholecystectomy      reports that she has quit smoking. She has never used smokeless tobacco. She reports that she drinks about 2.0 ounces of alcohol per week. She reports that she does not use illicit drugs. family history includes Diabetes in her father; Hyperlipidemia in her mother; Hypertension in her mother; and Prostate cancer in her father. Allergies  Allergen Reactions  . Amoxicillin-Pot Clavulanate Rash        Review of Systems:  The remainder of the 10 point ROS is negative except as outlined in H&P   Physical Exam: General appearance  Well developed, in no distress. Eyes- non icteric. HEENT nontraumatic, normocephalic. Mouth no lesions, tongue papillated, no cheilosis. Neck supple without adenopathy, thyroid not enlarged, no carotid bruits, no JVD. Lungs Clear to auscultation bilaterally. Cor normal S1, normal S2, regular  rhythm, no murmur,  quiet precordium. Abdomen: Soft abdomen with mild tenderness in left lower quadrant. No rebound. Liver edge at costal margin. Post cholecystectomy scar at the umbilicus. The right lower quadrant unremarkable. Rectal:Soft Hemoccult negative stool.  Extremities no pedal edema. Skin no lesions. Neurological alert and oriented x 3. Psychological normal mood and affect.  Assessment and Plan:  Problem #65 57 year old white female with signs of postcholecystectomy diarrhea as well as irritable bowel syndrome. She has changed her lifestyle by changing her job, resulting in decreased level of activity and consequently, a weight gain. We have discussed modification of her eating habits to include less carbohydrates and more exercise. We will start Bentyl 20 mg twice a day. The dose could be adjusted later. She will also start on Benefiber one heaping teaspoon daily. She is a good candidate for a screening colonoscopy to be scheduled the near future. I doubt that we are dealing with inflammatory bowel disease or microscopic colitis but this would be ruled out during the colonoscopy. We are going to check for her sprue profile today because of the bloating and indigestion and I have given her samples of Zegrid 20 mg to take for 2 weeks on a trial basis.   10/06/2012 [name]

## 2012-10-09 ENCOUNTER — Encounter: Payer: Self-pay | Admitting: Internal Medicine

## 2012-10-09 LAB — CELIAC PANEL 10
Endomysial Screen: NEGATIVE
Gliadin IgA: 4.3 U/mL (ref <20)
Gliadin IgG: 19.1 U/mL (ref <20)
IgA: 233 mg/dL (ref 69–380)
Tissue Transglut Ab: 4.8 U/mL (ref <20)
Tissue Transglutaminase Ab, IgA: 5.9 U/mL (ref <20)

## 2012-10-18 ENCOUNTER — Encounter: Payer: Self-pay | Admitting: Internal Medicine

## 2012-10-18 ENCOUNTER — Ambulatory Visit (AMBULATORY_SURGERY_CENTER): Payer: 59 | Admitting: Internal Medicine

## 2012-10-18 VITALS — BP 116/69 | HR 61 | Temp 97.7°F | Resp 18 | Ht 67.0 in | Wt 169.0 lb

## 2012-10-18 DIAGNOSIS — Z1211 Encounter for screening for malignant neoplasm of colon: Secondary | ICD-10-CM

## 2012-10-18 MED ORDER — SODIUM CHLORIDE 0.9 % IV SOLN: 500.0000 mL | INTRAVENOUS | Status: DC

## 2012-10-18 NOTE — Progress Notes (Signed)
Lidocaine-40mg IV prior to Propofol InductionPropofol given over incremental dosages 

## 2012-10-18 NOTE — Op Note (Signed)
[facility] [address] [place], [address]   COLONOSCOPY PROCEDURE REPORT  PATIENT: Amy, English  MR#: [id number] BIRTHDATE: 1956/02/20 , 57  yrs. old GENDER: Female ENDOSCOPIST: Hart Carwin, MD REFERRED XB:JYNWG Guest, M.D. PROCEDURE DATE:  10/18/2012 PROCEDURE:   Colonoscopy, screening First Screening Colonoscopy - Avg.  risk and is 50 yrs.  old or older Yes.  Prior Negative Screening - Now for repeat screening. N/A  History of Adenoma - Now for follow-up colonoscopy & has been > or = to 3 yrs.  N/A ASA CLASS:   Class I INDICATIONS:Average risk patient for colon cancer. MEDICATIONS: MAC sedation, administered by CRNA and propofol (Diprivan) 250mg  IV  DESCRIPTION OF PROCEDURE:   After the risks benefits and alternatives of the procedure were thoroughly explained, informed consent was obtained.  A digital rectal exam revealed no abnormalities of the rectum.   The LB PFC-H190 [id number]  endoscope was introduced through the anus and advanced to the cecum, which was identified by both the appendix and ileocecal valve. No adverse events experienced.   The quality of the prep was good, using MoviPrep  The instrument was then slowly withdrawn as the colon was fully examined.      COLON FINDINGS: A normal appearing cecum, ileocecal valve, and appendiceal orifice were identified.  The ascending, hepatic flexure, transverse, splenic flexure, descending, sigmoid colon and rectum appeared unremarkable.  No polyps or cancers were seen. Retroflexed views revealed no abnormalities. The time to cecum=  . Withdrawal time=  .  The scope was withdrawn and the procedure completed. COMPLICATIONS: There were no complications.  ENDOSCOPIC IMPRESSION: Normal colon  RECOMMENDATIONS: High fiber diet   eSigned:  Hart Carwin, MD 10/18/2012 3:24 PM   cc:

## 2012-10-18 NOTE — Patient Instructions (Addendum)
YOU HAD AN ENDOSCOPIC PROCEDURE TODAY AT THE [facility]: Refer to the procedure report that was given to you for any specific questions about what was found during the examination.  If the procedure report does not answer your questions, please call your gastroenterologist to clarify.  If you requested that your care partner not be given the details of your procedure findings, then the procedure report has been included in a sealed envelope for you to review at your convenience later.  YOU SHOULD EXPECT: Some feelings of bloating in the abdomen. Passage of more gas than usual.  Walking can help get rid of the air that was put into your GI tract during the procedure and reduce the bloating. If you had a lower endoscopy (such as a colonoscopy or flexible sigmoidoscopy) you may notice spotting of blood in your stool or on the toilet paper. If you underwent a bowel prep for your procedure, then you may not have a normal bowel movement for a few days.  DIET: Your first meal following the procedure should be a light meal and then it is ok to progress to your normal diet.  A half-sandwich or bowl of soup is an example of a good first meal.  Heavy or fried foods are harder to digest and may make you feel nauseous or bloated.  Likewise meals heavy in dairy and vegetables can cause extra gas to form and this can also increase the bloating.  Drink plenty of fluids but you should avoid alcoholic beverages for 24 hours.  ACTIVITY: Your care partner should take you home directly after the procedure.  You should plan to take it easy, moving slowly for the rest of the day.  You can resume normal activity the day after the procedure however you should NOT DRIVE or use heavy machinery for 24 hours (because of the sedation medicines used during the test).    SYMPTOMS TO REPORT IMMEDIATELY: A gastroenterologist can be reached at any hour.  During normal business hours, 8:30 AM to 5:00 PM Monday through Friday,  call [phone].  After hours and on weekends, please call the GI answering service at [phone] who will take a message and have the physician on call contact you.   Following lower endoscopy (colonoscopy or flexible sigmoidoscopy):  Excessive amounts of blood in the stool  Significant tenderness or worsening of abdominal pains  Swelling of the abdomen that is new, acute  Fever of 100F or higher    FOLLOW UP: If any biopsies were taken you will be contacted by phone or by letter within the next 1-3 weeks.  Call your gastroenterologist if you have not heard about the biopsies in 3 weeks.  Our staff will call the home number listed on your records the next business day following your procedure to check on you and address any questions or concerns that you may have at that time regarding the information given to you following your procedure. This is a courtesy call and so if there is no answer at the home number and we have not heard from you through the emergency physician on call, we will assume that you have returned to your regular daily activities without incident.  SIGNATURES/CONFIDENTIALITY: You and/or your care partner have signed paperwork which will be entered into your electronic medical record.  These signatures attest to the fact that that the information above on your After Visit Summary has been reviewed and is understood.  Full responsibility of the confidentiality   of this discharge information lies with you and/or your care-partner.  Normal colonoscopy.  High fiber diet information given. 

## 2012-10-18 NOTE — Progress Notes (Signed)
Patient did not experience any of the following events: a burn prior to discharge; a fall within the facility; wrong site/side/patient/procedure/implant event; or a hospital transfer or hospital admission upon discharge from the facility. (G8907) Patient did not have preoperative order for IV antibiotic SSI prophylaxis. (G8918)  

## 2012-10-19 ENCOUNTER — Telehealth: Payer: Self-pay

## 2012-10-19 NOTE — Telephone Encounter (Signed)
Left message on answering machine. 

## 2013-01-25 ENCOUNTER — Other Ambulatory Visit: Payer: Self-pay

## 2013-10-16 ENCOUNTER — Other Ambulatory Visit (HOSPITAL_COMMUNITY): Payer: Self-pay | Admitting: Obstetrics & Gynecology

## 2013-10-16 DIAGNOSIS — E049 Nontoxic goiter, unspecified: Secondary | ICD-10-CM

## 2013-10-19 ENCOUNTER — Ambulatory Visit (HOSPITAL_COMMUNITY): Payer: 59

## 2013-10-19 ENCOUNTER — Ambulatory Visit (HOSPITAL_COMMUNITY)
Admission: RE | Admit: 2013-10-19 | Discharge: 2013-10-19 | Disposition: A | Discharge status: Home / Self Care | Payer: 59 | Source: Ambulatory Visit | Attending: Obstetrics & Gynecology | Admitting: Obstetrics & Gynecology

## 2013-10-19 DIAGNOSIS — E049 Nontoxic goiter, unspecified: Secondary | ICD-10-CM | POA: Diagnosis not present

## 2014-06-06 ENCOUNTER — Encounter: Payer: Self-pay | Admitting: Nurse Practitioner

## 2014-06-06 ENCOUNTER — Telehealth: Payer: Self-pay

## 2014-06-06 ENCOUNTER — Ambulatory Visit (INDEPENDENT_AMBULATORY_CARE_PROVIDER_SITE_OTHER): Payer: 59 | Admitting: Nurse Practitioner

## 2014-06-06 ENCOUNTER — Telehealth: Payer: Self-pay | Admitting: Internal Medicine

## 2014-06-06 ENCOUNTER — Other Ambulatory Visit (INDEPENDENT_AMBULATORY_CARE_PROVIDER_SITE_OTHER): Payer: 59

## 2014-06-06 VITALS — BP 130/74 | HR 66 | Ht 67.0 in | Wt 165.0 lb

## 2014-06-06 DIAGNOSIS — R1012 Left upper quadrant pain: Secondary | ICD-10-CM

## 2014-06-06 DIAGNOSIS — K5900 Constipation, unspecified: Secondary | ICD-10-CM

## 2014-06-06 LAB — CBC WITH DIFFERENTIAL/PLATELET
Basophils Absolute: 0 10*3/uL (ref 0.0–0.1)
Basophils Relative: 0.6 % (ref 0.0–3.0)
Eosinophils Absolute: 0.2 10*3/uL (ref 0.0–0.7)
Eosinophils Relative: 2.9 % (ref 0.0–5.0)
HCT: 37.2 % (ref 36.0–46.0)
Hemoglobin: 12.7 g/dL (ref 12.0–15.0)
LYMPHS ABS: 1.9 10*3/uL (ref 0.7–4.0)
Lymphocytes Relative: 34.4 % (ref 12.0–46.0)
MCHC: 34.1 g/dL (ref 30.0–36.0)
MCV: 91.7 fl (ref 78.0–100.0)
Monocytes Absolute: 0.4 10*3/uL (ref 0.1–1.0)
Monocytes Relative: 7.3 % (ref 3.0–12.0)
NEUTROS ABS: 3 10*3/uL (ref 1.4–7.7)
NEUTROS PCT: 54.8 % (ref 43.0–77.0)
PLATELETS: 262 10*3/uL (ref 150.0–400.0)
RBC: 4.06 Mil/uL (ref 3.87–5.11)
RDW: 13.1 % (ref 11.5–15.5)
WBC: 5.4 10*3/uL (ref 4.0–10.5)

## 2014-06-06 LAB — COMPREHENSIVE METABOLIC PANEL
ALT: 17 U/L (ref 0–35)
AST: 14 U/L (ref 0–37)
Albumin: 4.8 g/dL (ref 3.5–5.2)
Alkaline Phosphatase: 42 U/L (ref 39–117)
BUN: 11 mg/dL (ref 6–23)
CALCIUM: 9.4 mg/dL (ref 8.4–10.5)
CO2: 29 mEq/L (ref 19–32)
Chloride: 102 mEq/L (ref 96–112)
Creatinine, Ser: 0.85 mg/dL (ref 0.40–1.20)
GFR: 72.78 mL/min (ref >60.00)
GLUCOSE: 106 mg/dL — AB (ref 70–99)
POTASSIUM: 3.8 meq/L (ref 3.5–5.1)
Sodium: 136 mEq/L (ref 135–145)
TOTAL PROTEIN: 7.2 g/dL (ref 6.0–8.3)
Total Bilirubin: 0.4 mg/dL (ref 0.2–1.2)

## 2014-06-06 LAB — LIPASE: LIPASE: 26 U/L (ref 11.0–59.0)

## 2014-06-06 LAB — AMYLASE: Amylase: 41 U/L (ref 27–131)

## 2014-06-06 MED ORDER — TRAMADOL HCL 50 MG PO TABS: 50.0000 mg | ORAL_TABLET | Freq: Four times a day (QID) | ORAL | Status: DC | PRN

## 2014-06-06 NOTE — Progress Notes (Signed)
     History of Present Illness:   Patient is a 59 year old female known to Dr. Juanda ChanceBrodie from a previous screening colonoscopy July 2014. The exam was normal. Patient referred [facility] by [facility] for evaluation of abdominal pain.   Three days ago patient developed left upper quadrant pain. She had chills on day pain started. The pain is constant with intermittent sharpness. It is exacerbated by laying flat or on the right side. Laying on her left side actually helps. Patient did not sleep more than 5 minutes last night because of the pain. She cannot really relate pain to eating. No associated nausea. Her only other associated symptom is that of slightly worsening constipation.  Her only surgery has been that of an emergent cholecystectomy one year ago. No history of pancreatitis. The last few hours the pain has sort of become more diffuse involving the mid to lower quadrants.  Patient does not take NSAIDs on a regular basis though she did take ibuprofen for 3 days leading up to the onset of this left upper quadrant pain. She has not seen any blood in her stools.  Current Medications, Allergies, Past Medical History, Past Surgical History, Family History and Social History were reviewed in [facility] electronic medical record.  Physical Exam: General: Pleasant, well developed , white female in no acute distress Head: Normocephalic and atraumatic Eyes:  sclerae anicteric, conjunctiva pink  Ears: Normal auditory acuity Lungs: Clear throughout to auscultation Heart: Regular rate and rhythm Abdomen: Mild left upper quadrant and mild left lower quadrant tenderness. Otherwise the abdomen is soft, it is nondistended and there are some bowel sounds . No masses, no hepatomegaly. Normal bowel sounds Rectal: no stool in vault.No masses felt Musculoskeletal: Symmetrical with no gross deformities  Extremities: No edema  Neurological: Alert oriented x 4, grossly nonfocal Psychological:  Alert  and cooperative. Normal mood and affect  Assessment and Recommendations:   331. 59 year old female referred by urgent care for 3 day history of upper abdominal pain. Her chronic constipation is slightly worse but no other associated symptoms. No regular use of NSAIDs. Abdominal exam is not overly concerning.   Will obtain CBC, CMET, amylase and lipase.   Will arrange for a CT scan of the abdomen and pelvis with contrast but in the interim would like to purge her bowels to see if pain improves.   We will call patient tomorrow to get a condition update and discuss labs results. Should she develop fevers or worsening pain patient will call us back or go to the emergency department.  Will give her Ultram to use as needed for the pain.

## 2014-06-06 NOTE — Patient Instructions (Signed)
Your physician has requested that you go to the basement for lab work before leaving today.  Purchase a bottle of Magnesium Citrate over the counter and drink it when you get home.  If this results in a disappearance of your pain, you may call [facility] at [phone] and leave a message cancelling your CT.  Nevin Bloodgood will call you with the results of the labs.   You have been scheduled for a CT scan of the abdomen and pelvis at [facility] ([address]---this is in the same building as [facility]).   You are scheduled on 06/07/2014 at 10:30am. You should arrive 15 minutes prior to your appointment time for registration. Please follow the written instructions below on the day of your exam:  WARNING: IF YOU ARE ALLERGIC TO IODINE/X-RAY DYE, PLEASE NOTIFY RADIOLOGY IMMEDIATELY AT [phone]! YOU WILL BE GIVEN A 13 HOUR PREMEDICATION PREP.  1) Do not eat or drink anything after 6:30am (4 hours prior to your test) 2) You have been given 2 bottles of oral contrast to drink. The solution may taste better if refrigerated, but do NOT add ice or any other liquid to this solution. Shake well before drinking.    Drink 1 bottle of contrast @ 8:30am (2 hours prior to your exam)  Drink 1 bottle of contrast @ 9:30am (1 hour prior to your exam)  You may take any medications as prescribed with a small amount of water except for the following: Metformin, Glucophage, Glucovance, Avandamet, Riomet, Fortamet, Actoplus Met, Janumet, Glumetza or Metaglip. The above medications must be held the day of the exam AND 48 hours after the exam.  The purpose of you drinking the oral contrast is to aid in the visualization of your intestinal tract. The contrast solution may cause some diarrhea. Before your exam is started, you will be given a small amount of fluid to drink. Depending on your individual set of symptoms, you may also receive an intravenous injection of x-ray contrast/dye. Plan on being at [facility] for 30 minutes or long, depending on the type of exam you are having performed.  If you have any questions regarding your exam or if you need to reschedule, you may call the CT department at [phone] between the hours of 8:00 am and 5:00 pm, Monday-Friday.  ________________________________________________________________________

## 2014-06-06 NOTE — Telephone Encounter (Signed)
[facility] the CT [facility] was going to schedule for her had already been authorized and could not be authorized again through us.  I called [facility] and spoke to [place] who said they would go ahead and schedule the already authorized CT with [facility].  They indicated they would contact the patient with the changes, but I called and left message for patient giving her a heads up that [facility] and instructing her that I had cancelled the [place] CT and she should not drink the contrast she was given.  I instructed her to call me first thing in the morning if she needed clarification.

## 2014-06-06 NOTE — Telephone Encounter (Signed)
Spoke with patient and scheduled OV with Willette ClusterPaula Guenther, NP today at 2:30 PM.

## 2014-06-07 ENCOUNTER — Inpatient Hospital Stay: Admission: RE | Admit: 2014-06-07 | Payer: 59 | Source: Ambulatory Visit

## 2014-06-07 ENCOUNTER — Telehealth: Payer: Self-pay | Admitting: Nurse Practitioner

## 2014-06-07 NOTE — Telephone Encounter (Signed)
Spoke with [facility] and clarified that [name] could drink the contrast we had given her at home.  I called her back and communicated their instructions for her to drink the first bottle at 8:30am, the 2nd bottle at 9:15 and arrive at 9:45 for her 10:00am appointment.   [name] agreed

## 2014-06-07 NOTE — Progress Notes (Signed)
Reviewed and agree., will need a follow up in case symptoms get worse.

## 2014-06-07 NOTE — Telephone Encounter (Signed)
Returning your call  [phone]

## 2014-06-11 ENCOUNTER — Telehealth: Payer: Self-pay | Admitting: Internal Medicine

## 2014-06-11 NOTE — Telephone Encounter (Signed)
Patient saw Willette ClusterPaula Guenther, NP last week. Over the weekend she had to go to Urgent Care and was diagnosed with shingles. She states she wants to come and see a doctor to be sure she has shingles. Instructed patient that she should see a PCP to verify this. She states she does not have a PCP. Explained that we are a GI speciality and she would need an internal medicine MD for shingles.

## 2014-08-22 ENCOUNTER — Ambulatory Visit (INDEPENDENT_AMBULATORY_CARE_PROVIDER_SITE_OTHER): Payer: 59 | Admitting: Podiatry

## 2014-08-22 ENCOUNTER — Encounter: Payer: Self-pay | Admitting: Podiatry

## 2014-08-22 ENCOUNTER — Ambulatory Visit (INDEPENDENT_AMBULATORY_CARE_PROVIDER_SITE_OTHER): Payer: 59

## 2014-08-22 VITALS — BP 132/88 | HR 60 | Resp 12

## 2014-08-22 DIAGNOSIS — M21612 Bunion of left foot: Secondary | ICD-10-CM

## 2014-08-22 DIAGNOSIS — M2012 Hallux valgus (acquired), left foot: Secondary | ICD-10-CM | POA: Diagnosis not present

## 2014-08-22 DIAGNOSIS — L03031 Cellulitis of right toe: Secondary | ICD-10-CM

## 2014-08-22 DIAGNOSIS — L03011 Cellulitis of right finger: Secondary | ICD-10-CM

## 2014-08-22 DIAGNOSIS — L600 Ingrowing nail: Secondary | ICD-10-CM

## 2014-08-22 NOTE — Progress Notes (Signed)
   Subjective:    Patient ID: [name], female    DOB: 07-10-1955, 59 y.o.   MRN: [id number]  HPI  Pt stated rt foot great toenail have bruise/discoloration and sore for 6 months. The toenail is looking little worse and get aggravated by pressure. Tried no treatment.  Review of Systems  Skin: Positive for color change.       Objective:   Physical Exam        Assessment & Plan:

## 2014-08-22 NOTE — Progress Notes (Signed)
Subjective:     Patient ID: [name], female   DOB: Jul 10, 1955, 59 y.o.   MRN: [id number]  HPI patient presents stating she has a damage right big toenail of one-month duration with some drainage on the lateral side and a structural bunion left it's becoming tender with shoe gear over the last year. States the toenail is bothering her and not sure if a new nail is growing in   Review of Systems  All other systems reviewed and are negative.      Objective:   Physical Exam  Cardiovascular: Intact distal pulses.   Musculoskeletal: Normal range of motion.  Neurological: She is alert.  Skin: Skin is warm.  Nursing note and vitals reviewed.  neurovascular status intact with muscle strength adequate range of motion within normal limits. Patient has a damaged discolored right hallux nail with drainage on the lateral side that's localized with no proximal edema erythema or drainage noted. Patient has redness around the first metatarsal head left with pain and structural deformity it is noted to have good digital perfusion and is well oriented 3     Assessment:     Structural nail disease right hallux secondary to trauma with mild localized paronychia infection and structural HAV deformity left    Plan:     H&P and conditions reviewed. For the nail I recommended removal of the corner and possible the entire nail and I infiltrated 60 mg Xylocaine Marcaine mixture remove the lateral side remove some proud flesh and abscess tissue and then due to looseness remove the entire nail allowing new nail to regrow. Reviewed x-rays of the left foot and discussed structural bunion correction which she wants to do the future and she will call when the time is right for her. Patient will soak the right nail and reappoint if any issues should occur

## 2014-08-22 NOTE — Patient Instructions (Signed)

## 2015-07-05 IMAGING — US US SOFT TISSUE HEAD/NECK
1 series · 14 of 25 positions shown · non-contrast
Comparison: None.

CLINICAL DATA: Thyromegaly, difficulty swallowing.

EXAM:
THYROID ULTRASOUND
TECHNIQUE: Ultrasound examination of the thyroid gland and adjacent soft
tissues was performed.

[Series 1: us soft tissue head/neck · 0.06mm/px · 14 of 33 slices shown]
[im 1/33]
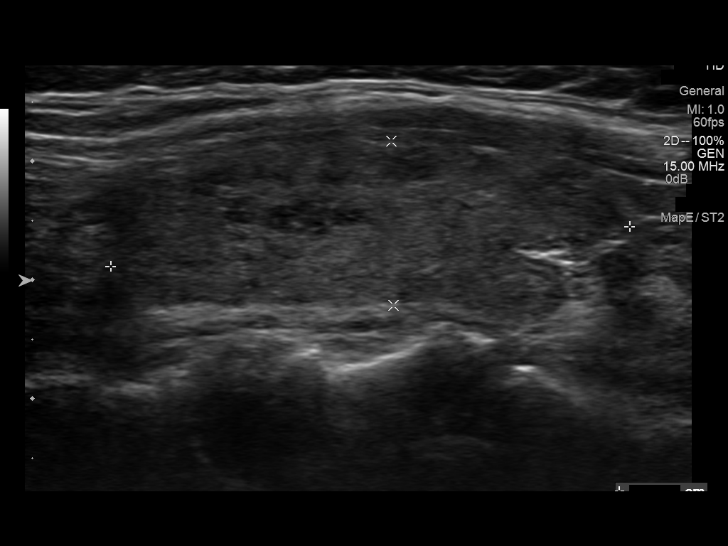
[im 3/33]
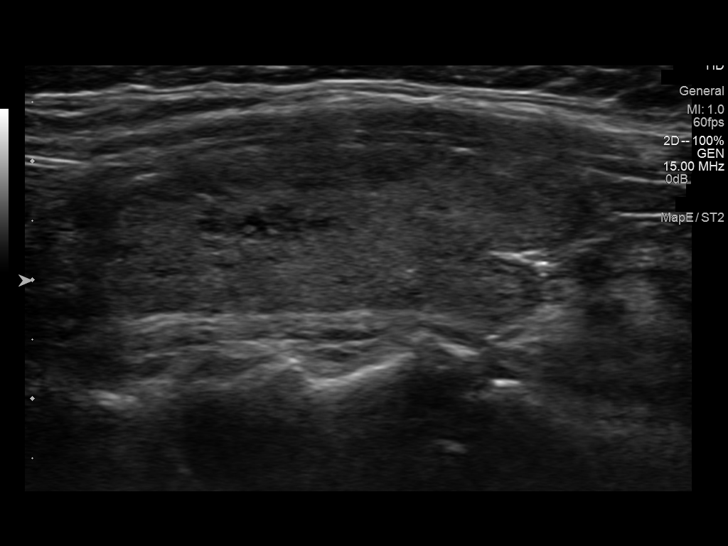
[im 6/33]
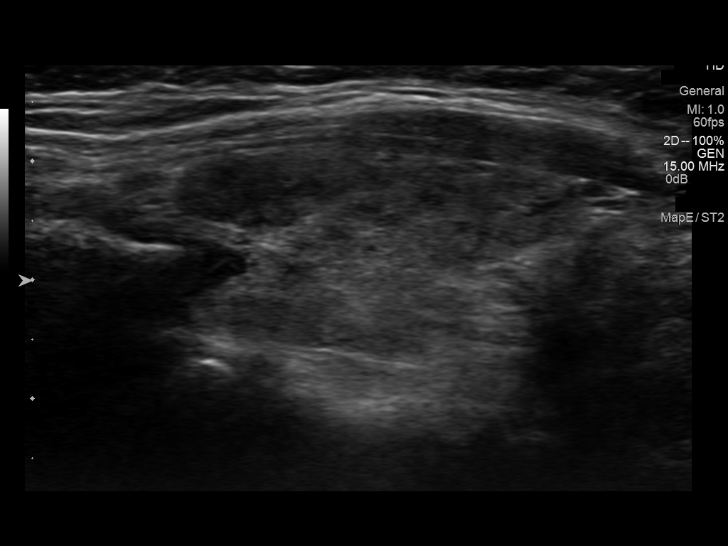
[im 9/33]
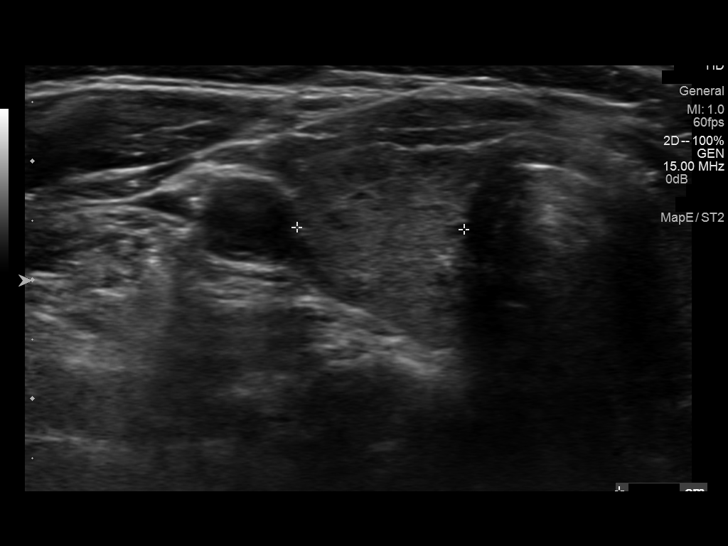
[im 11/33]
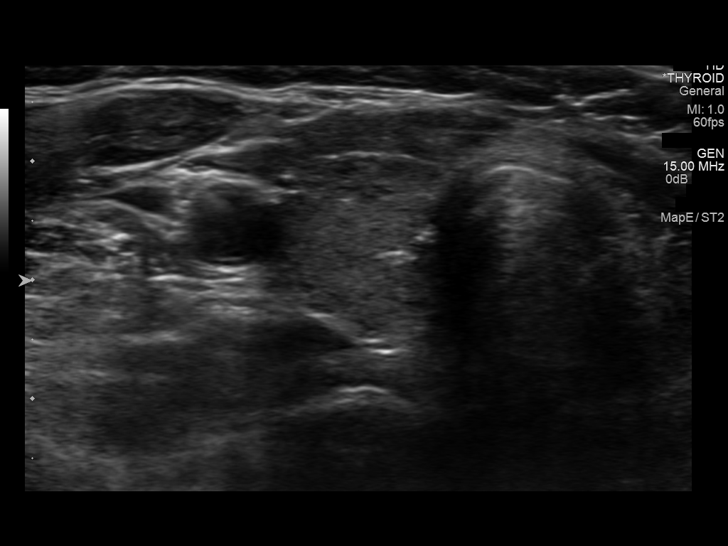
[im 13/33]
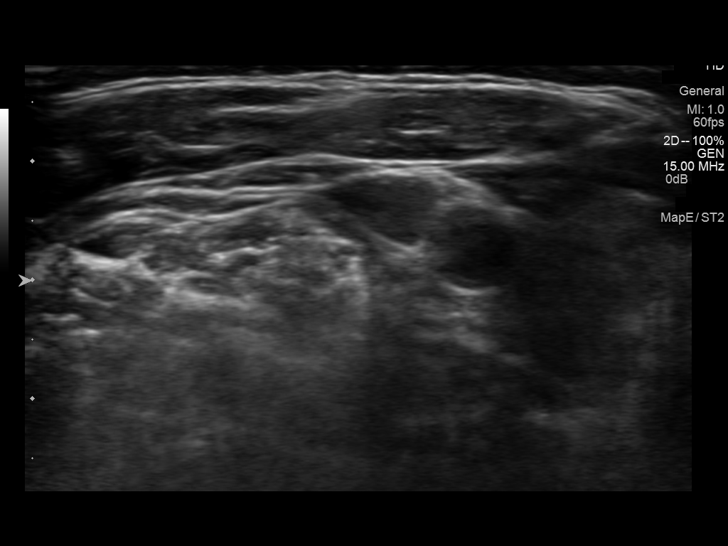
[im 15/33]
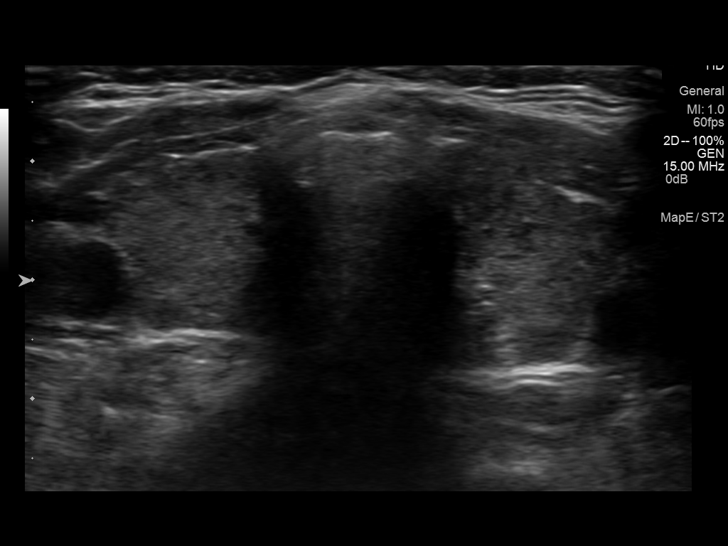
[im 18/33]
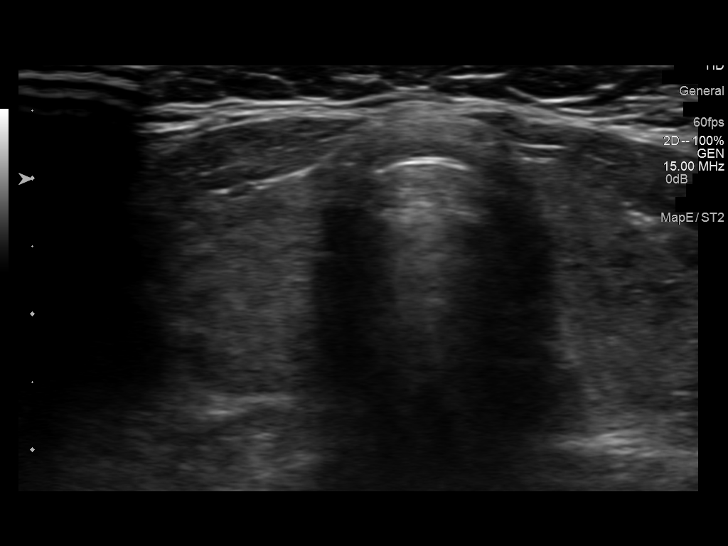
[im 21/33]
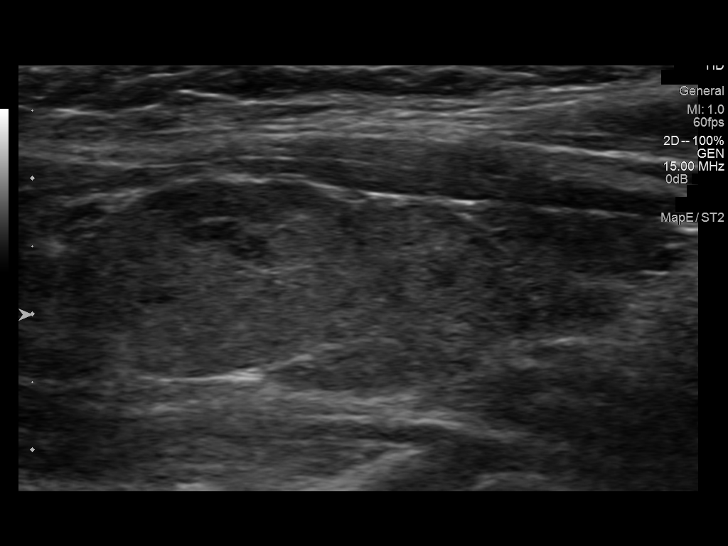
[im 22/33]
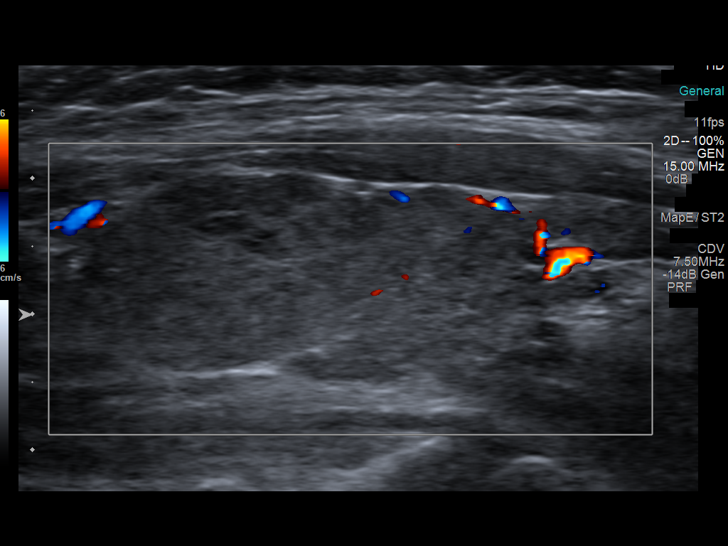
[im 25/33]
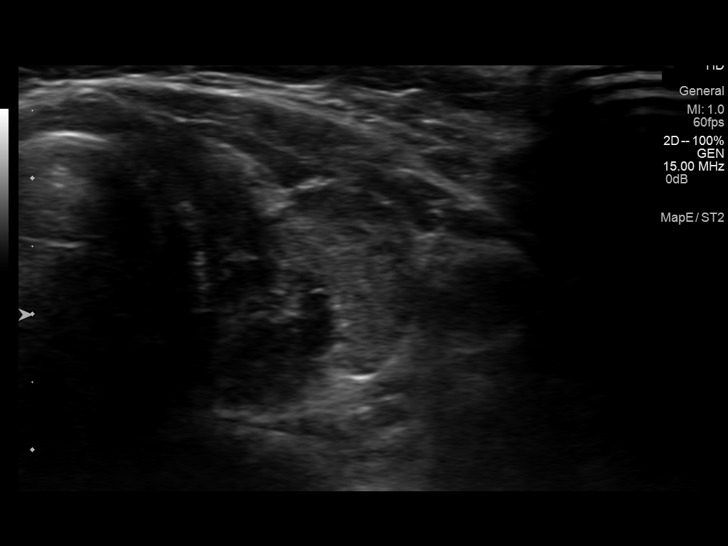
[im 27/33]
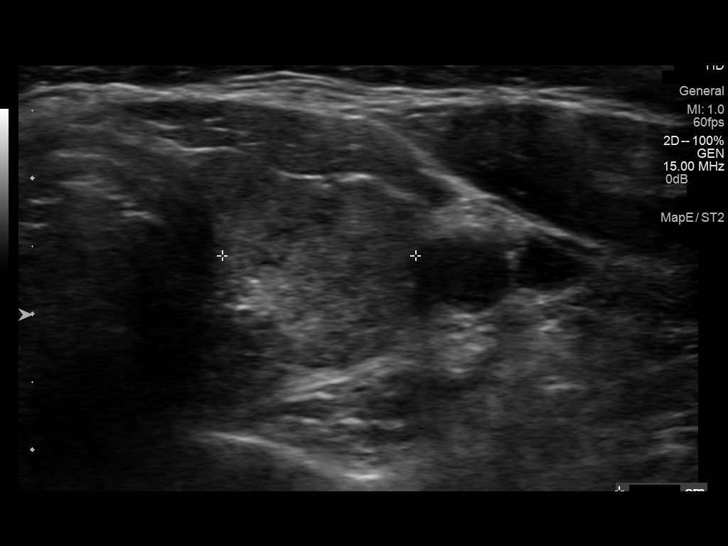
[im 30/33]
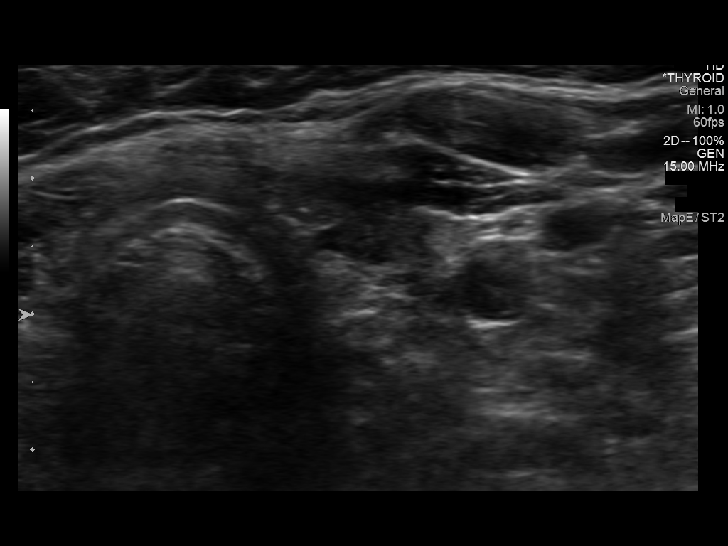
[im 33/33]
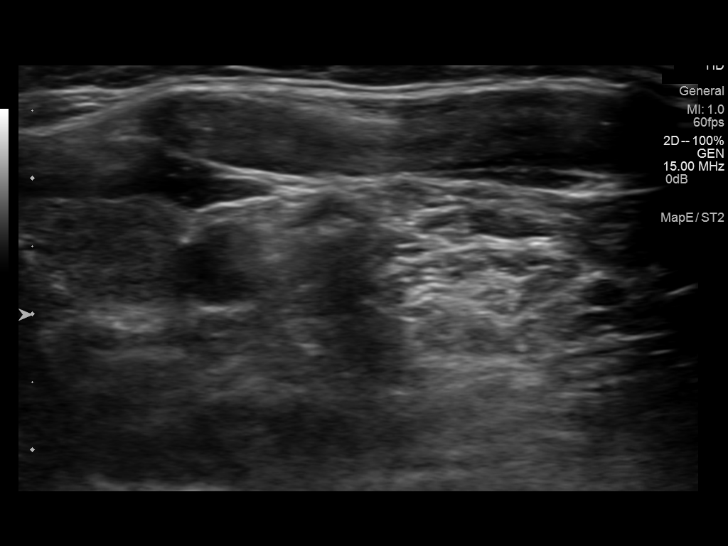

[14 of 25 positions shown; findings below may reference images not displayed]

FINDINGS: There is diffuse heterogeneity of thyroid parenchymal echotexture.

Right thyroid lobe

Measurements: Normal in size measured 4.1 x 1.4 x 1.4 cm.

There is diffuse heterogeneity of the right lobe of the thyroid
without discrete nodule.

Left thyroid lobe

Measurements: Normal in size measuring 4.3 x 1.3 x 1.4 cm.

There is diffuse heterogeneity of the left lobe of the thyroid
without discrete nodule.

Isthmus

Thickness: Normal in size measures 0.1 cm in diameter..

No discrete nodules are identified within the thyroid isthmus.

Lymphadenopathy

None visualized.
IMPRESSION: Nonspecific diffuse heterogeneity of the thyroid parenchyma without
discrete nodule.

## 2017-11-16 DIAGNOSIS — M25562 Pain in left knee: Secondary | ICD-10-CM | POA: Insufficient documentation

## 2017-11-23 ENCOUNTER — Encounter: Payer: Self-pay | Admitting: Skilled Nursing Facility1

## 2017-11-23 ENCOUNTER — Encounter: Payer: 59 | Attending: Obstetrics & Gynecology | Admitting: Skilled Nursing Facility1

## 2017-11-23 DIAGNOSIS — R635 Abnormal weight gain: Secondary | ICD-10-CM | POA: Diagnosis not present

## 2017-11-23 DIAGNOSIS — Z713 Dietary counseling and surveillance: Secondary | ICD-10-CM | POA: Insufficient documentation

## 2017-11-23 NOTE — Progress Notes (Signed)
  Assessment:  Primary concerns today: weight gain.   Pt states believes she has type 2 diabetes.  According to pts labs her fasting glucose was 131 but there is no A1C. Pt states she has been intermittent fasting.  Pt states she has been very confused by all the nutrition information that is out there but is ready to just be consistent.    MEDICATIONS: see list   DIETARY INTAKE:  Usual eating pattern includes 2 meals and 3 snacks per day.  Everyday foods include crackers/chips.  Avoided foods include none stated.    24-hr recall:  B ( AM): skipped or fruit and nuts in yogurt or 1 frozen waffle or 1 egg Snk (11 AM): nuts L ( 1 PM): salads with chicken sometimes with blue cheese sometimes with lemon Snk ( PM): chips and dip or crackers and dips D ( PM): hamburger and french fries or chicken with roasted vegetables or eggplant parmasean Snk ( PM):  Beverages: water: 72 ounces; regular coffee, unsweet tea, mimosa, wine (3 drinks 2-3 days a week)  Usual physical activity: working with a trainer: 3 days a week 60 minutes   Estimated energy needs: 1500 calories 170 g carbohydrates 112 g protein 42 g fat  Progress Towards Goal(s):  In progress.    Intervention:  Nutrition counseling. Dietitian educated the pt on diabetes and carb counting. Goals: -Aim to get up every hour while at work -Recommended 1 alcoholic beverage per day for a woman  -Try Kefir -Read your cracker/chip bag for the serving size also do this with your dip or cheese  -Choose whole wheat/whole grain more often -Ask your doctor what your A1C is  Teaching Method Utilized: Visual Auditory Hands on  Handouts given during visit include:  Diabetes book  Yellow card  Barriers to learning/adherence to lifestyle change: none identified  Demonstrated degree of understanding via:  Teach Back   Monitoring/Evaluation:  Dietary intake, exercise, and body weight prn.

## 2017-11-23 NOTE — Patient Instructions (Addendum)
-  Aim to get up every hour while at work  -Recommended 1 alcoholic beverage per day for a woman   -Try Kefir  -Read your cracker/chip bag for the serving size also do this with your dip or cheese   -Choose whole wheat/whole grain more often

## 2017-12-28 ENCOUNTER — Ambulatory Visit: Payer: 59 | Admitting: Skilled Nursing Facility1

## 2018-01-04 ENCOUNTER — Encounter: Payer: Self-pay | Admitting: Skilled Nursing Facility1

## 2018-01-04 ENCOUNTER — Encounter: Payer: 59 | Attending: Obstetrics & Gynecology | Admitting: Skilled Nursing Facility1

## 2018-01-04 DIAGNOSIS — Z713 Dietary counseling and surveillance: Secondary | ICD-10-CM | POA: Insufficient documentation

## 2018-01-04 DIAGNOSIS — R635 Abnormal weight gain: Secondary | ICD-10-CM | POA: Diagnosis not present

## 2018-01-04 NOTE — Patient Instructions (Signed)
-  Always bring your meter with you everywhere you go -Always Properly dispose of your needles:  -Discard in a hard plastic/metal container with a lid (something the needle can't puncture)  -Write Do Not Recycle on the outside of the container  -Example: A laundry detergent bottle -Never use the same needle more than once -Eat 2 carbohydrate choices for each meal and 1 for each snack -A meal: carbohydrates, protein, vegetable -A snack: A Fruit OR Vegetable AND Protein  -Try to be as active as possible -Always pay attention to your body keeping watchful of possible low blood sugar (below 70) or high blood sugar (above 200)  In the morning before you eat anything below 130 and 2 hours after a meal under 180  If blood sugars low: eat a simple carb (regular hard candy or fat free cows milk or regular juice) then wait 15 minutes and check blood sugar again then have a meal -Check your feet every day looking for anything that was not there the day before  -American Diabetes Association

## 2018-01-04 NOTE — Progress Notes (Signed)
  Assessment:  Primary concerns today: weight gain.   Pt states she has been having knee pain so she has been taking an antiinflammatory. Pts cholesterol 217 and fasting glucose 131. Pt states she thinks she remembers her doctor telling her her A1C is 7. Pt states this past month she has been in a hotel for the last 2 months sos she has been eating out a lot. Pt states she drinks kefir every morning even if she does not like it. Pt states she also visiited [place]. Pt states she is now back in her home. Pt states she has been sleeping much better and getting up every hour at work.  Pt had specific questions which were answered to her satisfaction.   MEDICATIONS: see list   DIETARY INTAKE:  Usual eating pattern includes 2 meals and 3 snacks per day.  Everyday foods include crackers/chips.  Avoided foods include none stated.    24-hr recall: eating 5 times a day B ( AM): skipped or fruit and nuts in yogurt or 1 frozen waffle or 1 egg Snk (11 AM): nuts L ( 1 PM): salads with chicken sometimes with blue cheese sometimes with lemon Snk ( PM): chips and dip or crackers and dips D ( PM): hamburger and french fries or chicken with roasted vegetables or eggplant parmasean Snk ( PM):  Beverages: water: 72 ounces; regular coffee, unsweet tea, mimosa, wine (3 drinks 2-3 days a week)  Usual physical activity: working with a trainer: 3 days a week 60 minutes   Estimated energy needs: 1500 calories 170 g carbohydrates 112 g protein 42 g fat  Progress Towards Goal(s):  In progress.    Intervention:  Nutrition counseling. Dietitian educated the pt on diabetes and carb counting. Goals: -Always bring your meter with you everywhere you go -Always Properly dispose of your needles:  -Discard in a hard plastic/metal container with a lid (something the needle can't puncture)  -Write Do Not Recycle on the outside of the container  -Example: A laundry detergent bottle -Never use the same needle more than  once -Eat 2 carbohydrate choices for each meal and 1 for each snack -A meal: carbohydrates, protein, vegetable -A snack: A Fruit OR Vegetable AND Protein  -Try to be as active as possible -Always pay attention to your body keeping watchful of possible low blood sugar (below 70) or high blood sugar (above 200) In the morning before you eat anything below 130 and 2 hours after a meal under 180 If blood sugars low: eat a simple carb (regular hard candy or fat free cows milk or regular juice) then wait 15 minutes and check blood sugar again then have a meal -Check your feet every day looking for anything that was not there the day before -American Diabetes Association   Teaching Method Utilized: Visual Auditory Hands on  Handouts given during visit include:  Diabetes book  Yellow card  Barriers to learning/adherence to lifestyle change: none identified  Demonstrated degree of understanding via:  Teach Back   Monitoring/Evaluation:  Dietary intake, exercise, and body weight prn.

## 2018-05-03 ENCOUNTER — Ambulatory Visit: Payer: 59 | Admitting: Podiatry

## 2018-05-03 ENCOUNTER — Encounter: Payer: Self-pay | Admitting: Podiatry

## 2018-05-03 DIAGNOSIS — L600 Ingrowing nail: Secondary | ICD-10-CM | POA: Diagnosis not present

## 2018-05-03 DIAGNOSIS — B351 Tinea unguium: Secondary | ICD-10-CM

## 2018-05-07 NOTE — Progress Notes (Signed)
Subjective:   Patient ID: [name], female   DOB: 63 y.o.   MRN: [id number]   HPI Patient presents concerned about discoloration of the right big toenail and is concerned about possibility for ingrown component as it is been mildly tender.  States it seems to be getting slightly better as she has been soaking the patient also does not smoke and likes to be active   Review of Systems  All other systems reviewed and are negative.       Objective:  Physical Exam Vitals signs and nursing note reviewed.  Constitutional:      Appearance: She is well-developed.  Pulmonary:     Effort: Pulmonary effort is normal.  Musculoskeletal: Normal range of motion.  Skin:    General: Skin is warm.  Neurological:     Mental Status: She is alert.     Neurovascular status intact muscle strength is adequate range of motion within normal limits with patient noted to have mild incurvation right hallux lateral border that is tender when pressed and is noted to have localized irritation.  Patient states that there is concerned about the color and she wanted it checked     Assessment:  Low-grade ingrown toenail deformity right hallux with probable trauma the nail with discoloration     Plan:  H&P conditions reviewed and at this point will get a hold off on treatment with strong possibility for ingrown toenail removal in the future.  Educated her on this and I explained at great length and she is willing to hold off and will be seen back as needed

## 2018-09-27 NOTE — H&P (Signed)
TOTAL KNEE ADMISSION H&P  Patient is being admitted for left total knee arthroplasty.  Subjective:  Chief Complaint:left knee pain.  HPI: [name], 63 y.o. female, has a history of pain and functional disability in the left knee due to arthritis and has failed non-surgical conservative treatments for greater than 12 weeks to includeNSAID's and/or analgesics, corticosteriod injections, viscosupplementation injections, flexibility and strengthening excercises, supervised PT with diminished ADL's post treatment, use of assistive devices and activity modification.  Onset of symptoms was gradual, starting 1 years ago with rapidlly worsening course since that time. The patient noted prior procedures on the knee to include  arthroscopy and menisectomy on the left knee(s).  Patient currently rates pain in the left knee(s) at 10 out of 10 with activity. Patient has night pain, worsening of pain with activity and weight bearing, pain that interferes with activities of daily living, crepitus and joint swelling.  Patient has evidence of subchondral sclerosis, periarticular osteophytes and joint space narrowing by imaging studies.  There is no active infection.  Patient Active Problem List   Diagnosis Date Noted  . Pain in left knee 11/16/2017   Past Medical History:  Diagnosis Date  . GERD (gastroesophageal reflux disease)    resolved with cholecystectomy  . Tubal pregnancy     Past Surgical History:  Procedure Laterality Date  . CHOLECYSTECTOMY    . ECTOPIC PREGNANCY SURGERY      No current facility-administered medications for this encounter.    Current Outpatient Medications  Medication Sig Dispense Refill Last Dose  . estradiol (ESTRACE) 0.1 MG/GM vaginal cream Place 0.25 Applicatorfuls vaginally 2 (two) times a week.   Past Week at Unknown time  . fluticasone (FLONASE) 50 MCG/ACT nasal spray Place 2 sprays into both nostrils daily as needed for allergies.    09/27/2018 at Unknown time  .  ketotifen (ZADITOR) 0.025 % ophthalmic solution Place 1 drop into both eyes daily as needed (for dry eyes).   09/26/2018 at Unknown time  . meloxicam (MOBIC) 15 MG tablet Take 15 mg by mouth every other day.    09/27/2018 at Unknown time  . Polyethyl Glycol-Propyl Glycol (SYSTANE ULTRA) 0.4-0.3 % SOLN Place 2 drops into both eyes daily as needed (for dry eyes).   09/26/2018 at Unknown time   Allergies  Allergen Reactions  . Amoxicillin-Pot Clavulanate Rash    Social History   Tobacco Use  . Smoking status: Former Games developermoker  . Smokeless tobacco: Never Used  Substance Use Topics  . Alcohol use: Yes    Alcohol/week: 4.0 standard drinks    Types: 4 drink(s) per week    Comment: usually on the weekends    Family History  Problem Relation Age of Onset  . Prostate cancer Father   . Diabetes Father   . Hypertension Mother   . Hyperlipidemia Mother      ROS  Objective:  Physical Exam  Vital signs in last 24 hours: Temp:  [98 F (36.7 C)] 98 F (36.7 C) (07/08 1500) Pulse Rate:  [91] 91 (07/08 1500) BP: (126)/(90) 126/90 (07/08 1500) SpO2:  [100 %] 100 % (07/08 1500) Weight:  [76.2 kg] 76.2 kg (07/08 1500)  Labs:   Estimated body mass index is 26.31 kg/m as calculated from the following:   Height as of this encounter: 5\' 7"  (1.702 m).   Weight as of this encounter: 76.2 kg.   Imaging Review Plain radiographs demonstrate severe degenerative joint disease of the left knee(s). The overall alignment issignificant varus.  The bone quality appears to be good for age and reported activity level.      Assessment/Plan:  End stage arthritis, left knee   The patient history, physical examination, clinical judgment of the provider and imaging studies are consistent with end stage degenerative joint disease of the left knee(s) and total knee arthroplasty is deemed medically necessary. The treatment options including medical management, injection therapy arthroscopy and arthroplasty were  discussed at length. The risks and benefits of total knee arthroplasty were presented and reviewed. The risks due to aseptic loosening, infection, stiffness, patella tracking problems, thromboembolic complications and other imponderables were discussed. The patient acknowledged the explanation, agreed to proceed with the plan and consent was signed. Patient is being admitted for inpatient treatment for surgery, pain control, PT, OT, prophylactic antibiotics, VTE prophylaxis, progressive ambulation and ADL's and discharge planning. The patient is planning to be discharged home with home health services    Anticipated LOS equal to or greater than 2 midnights due to - Age 39 and older with one or more of the following:  - Obesity  - Expected need for hospital services (PT, OT, Nursing) required for safe  discharge

## 2018-09-27 NOTE — Patient Instructions (Addendum)
YOU NEED TO HAVE A COVID 19 TEST ON 10-05-2018  @ 10:05 AM. THIS TEST MUST BE DONE BEFORE SURGERY, [facility]. ONCE YOUR COVID TEST IS COMPLETED, PLEASE BEGIN THE QUARANTINE INSTRUCTIONS AS OUTLINED IN YOUR HANDOUT.                Amy LackSusan English    Your procedure is scheduled on: 10-09-2018  [facility]    Report to admitting at  7:20 AM      Call this number if you have problems the morning of surgery [phone]    Remember:.BRUSH YOUR TEETH MORNING OF SURGERY AND RINSE YOUR MOUTH OUT, NO CHEWING GUM CANDY OR MINTS.    Do not eat food After Midnight. YOU MAY HAVE CLEAR LIQUIDS FROM MIDNIGHT UNTIL 6:50AM. At 6:50AM Please finish the prescribed Pre-Surgery ENSURE drink. Nothing by mouth after you finish the ENSURE drink !   CLEAR LIQUID DIET   Foods Allowed                                                                     Foods Excluded  Coffee and tea, regular and decaf                             liquids that you cannot  Plain Jell-O in any flavor                                             see through such as: Fruit ices (not with fruit pulp)                                     milk, soups, orange juice  Iced Popsicles                                    All solid food Carbonated beverages, regular and diet                                    Cranberry, grape and apple juices Sports drinks like Gatorade Lightly seasoned clear broth or consume(fat free) Sugar, honey syrup  Sample Menu Breakfast                                Lunch                                     Supper Cranberry juice                    Beef broth  Chicken broth Jell-O                                     Grape juice                           Apple juice Coffee or tea                        Jell-O                                      Popsicle                                                Coffee or tea                         Coffee or tea  _____________________________________________________________________     Take these medicines the morning of surgery with A SIP OF WATER: nasal spray, eye drops               You may not have any metal on your body including hair pins and              piercings  Do not wear jewelry, make-up, lotions, powders or perfumes, deodorant                          Men may shave face and neck.   Do not bring valuables to the hospital. [place].  Contacts, dentures or bridgework may not be worn into surgery.  Leave suitcase in the car. After surgery it may be brought to your room.      _____________________________________________________________________             [facility] - Preparing for Surgery Before surgery, you can play an important role.  Because skin is not sterile, your skin needs to be as free of germs as possible.  You can reduce the number of germs on your skin by washing with CHG (chlorahexidine gluconate) soap before surgery.  CHG is an antiseptic cleaner which kills germs and bonds with the skin to continue killing germs even after washing. Please DO NOT use if you have an allergy to CHG or antibacterial soaps.  If your skin becomes reddened/irritated stop using the CHG and inform your nurse when you arrive at Short Stay. Do not shave (including legs and underarms) for at least 48 hours prior to the first CHG shower.  You may shave your face/neck. Please follow these instructions carefully:  1.  Shower with CHG Soap the night before surgery and the  morning of Surgery.  2.  If you choose to wash your hair, wash your hair first as usual with your  normal  shampoo.  3.  After you shampoo, rinse your hair and body thoroughly to remove the  shampoo.  4.  Use CHG as you would any other liquid soap.  You can apply chg directly  to the skin and wash                        Gently with a scrungie or clean washcloth.  5.  Apply the CHG Soap to your body ONLY FROM THE NECK DOWN.   Do not use on face/ open                           Wound or open sores. Avoid contact with eyes, ears mouth and genitals (private parts).                       Wash face,  Genitals (private parts) with your normal soap.             6.  Wash thoroughly, paying special attention to the area where your surgery  will be performed.  7.  Thoroughly rinse your body with warm water from the neck down.  8.  DO NOT shower/wash with your normal soap after using and rinsing off  the CHG Soap.                9.  Pat yourself dry with a clean towel.            10.  Wear clean pajamas.            11.  Place clean sheets on your bed the night of your first shower and do not  sleep with pets. Day of Surgery : Do not apply any lotions/deodorants the morning of surgery.  Please wear clean clothes to the hospital/surgery center.  FAILURE TO FOLLOW THESE INSTRUCTIONS MAY RESULT IN THE CANCELLATION OF YOUR SURGERY PATIENT SIGNATURE_________________________________  NURSE SIGNATURE__________________________________  ________________________________________________________________________   [name]  An incentive spirometer is a tool that can help keep your lungs clear and active. This tool measures how well you are filling your lungs with each breath. Taking long deep breaths may help reverse or decrease the chance of developing breathing (pulmonary) problems (especially infection) following:  A long period of time when you are unable to move or be active. BEFORE THE PROCEDURE   If the spirometer includes an indicator to show your best effort, your nurse or respiratory therapist will set it to a desired goal.  If possible, sit up straight or lean slightly forward. Try not to slouch.  Hold the incentive spirometer in an upright position. INSTRUCTIONS FOR USE  1. Sit on the edge of your bed  if possible, or sit up as far as you can in bed or on a chair. 2. Hold the incentive spirometer in an upright position. 3. Breathe out normally. 4. Place the mouthpiece in your mouth and seal your lips tightly around it. 5. Breathe in slowly and as deeply as possible, raising the piston or the ball toward the top of the column. 6. Hold your breath for 3-5 seconds or for as long as possible. Allow the piston or ball to fall to the bottom of the column. 7. Remove the mouthpiece from your mouth and breathe out normally. 8. Rest for a few seconds and repeat Steps 1 through 7 at least 10 times every 1-2 hours when you are awake. Take your time and take a few normal breaths between deep breaths. 9. The spirometer may include an indicator to  show your best effort. Use the indicator as a goal to work toward during each repetition. 10. After each set of 10 deep breaths, practice coughing to be sure your lungs are clear. If you have an incision (the cut made at the time of surgery), support your incision when coughing by placing a pillow or rolled up towels firmly against it. Once you are able to get out of bed, walk around indoors and cough well. You may stop using the incentive spirometer when instructed by your caregiver.  RISKS AND COMPLICATIONS  Take your time so you do not get dizzy or light-headed.  If you are in pain, you may need to take or ask for pain medication before doing incentive spirometry. It is harder to take a deep breath if you are having pain. AFTER USE  Rest and breathe slowly and easily.  It can be helpful to keep track of a log of your progress. Your caregiver can provide you with a simple table to help with this. If you are using the spirometer at home, follow these instructions: [place] IF:   You are having difficultly using the spirometer.  You have trouble using the spirometer as often as instructed.  Your pain medication is not giving enough relief while  using the spirometer.  You develop fever of 100.5 F (38.1 C) or higher. SEEK IMMEDIATE MEDICAL CARE IF:   You cough up bloody sputum that had not been present before.  You develop fever of 102 F (38.9 C) or greater.  You develop worsening pain at or near the incision site. MAKE SURE YOU:   Understand these instructions.  Will watch your condition.  Will get help right away if you are not doing well or get worse. Document Released: 07/19/2006 Document Revised: 05/31/2011 Document Reviewed: 09/19/2006 [facility] Patient Information 2014 [place], [place].   ________________________________________________________________________

## 2018-09-29 ENCOUNTER — Encounter (HOSPITAL_COMMUNITY): Payer: Self-pay

## 2018-09-29 ENCOUNTER — Encounter (HOSPITAL_COMMUNITY)
Admission: RE | Admit: 2018-09-29 | Discharge: 2018-09-29 | Disposition: A | Discharge status: Home / Self Care | Payer: 59 | Source: Ambulatory Visit | Attending: Orthopedic Surgery | Admitting: Orthopedic Surgery

## 2018-09-29 ENCOUNTER — Other Ambulatory Visit: Payer: Self-pay

## 2018-09-29 DIAGNOSIS — Z01812 Encounter for preprocedural laboratory examination: Secondary | ICD-10-CM | POA: Insufficient documentation

## 2018-09-29 DIAGNOSIS — M1712 Unilateral primary osteoarthritis, left knee: Secondary | ICD-10-CM | POA: Insufficient documentation

## 2018-09-29 HISTORY — DX: Unspecified osteoarthritis, unspecified site: M19.90

## 2018-09-29 HISTORY — DX: Unspecified sensorineural hearing loss: H90.5

## 2018-09-29 LAB — COMPREHENSIVE METABOLIC PANEL
ALT: 16 U/L (ref 0–44)
AST: 16 U/L (ref 15–41)
Albumin: 4.6 g/dL (ref 3.5–5.0)
Alkaline Phosphatase: 44 U/L (ref 38–126)
Anion gap: 9 (ref 5–15)
BUN: 16 mg/dL (ref 8–23)
CO2: 25 mmol/L (ref 22–32)
Calcium: 9.1 mg/dL (ref 8.9–10.3)
Chloride: 101 mmol/L (ref 98–111)
Creatinine, Ser: 0.69 mg/dL (ref 0.44–1.00)
GFR calc Af Amer: >60 mL/min (ref >60)
GFR calc non Af Amer: >60 mL/min (ref >60)
Glucose, Bld: 110 mg/dL — ABNORMAL HIGH (ref 70–99)
Potassium: 4 mmol/L (ref 3.5–5.1)
Sodium: 135 mmol/L (ref 135–145)
Total Bilirubin: 0.6 mg/dL (ref 0.3–1.2)
Total Protein: 7.2 g/dL (ref 6.5–8.1)

## 2018-09-29 LAB — CBC WITH DIFFERENTIAL/PLATELET
Abs Immature Granulocytes: 0.01 10*3/uL (ref 0.00–0.07)
Basophils Absolute: 0 10*3/uL (ref 0.0–0.1)
Basophils Relative: 1 %
Eosinophils Absolute: 0.3 10*3/uL (ref 0.0–0.5)
Eosinophils Relative: 6 %
HCT: 38.7 % (ref 36.0–46.0)
Hemoglobin: 12.4 g/dL (ref 12.0–15.0)
Immature Granulocytes: 0 %
Lymphocytes Relative: 35 %
Lymphs Abs: 1.7 10*3/uL (ref 0.7–4.0)
MCH: 31.8 pg (ref 26.0–34.0)
MCHC: 32 g/dL (ref 30.0–36.0)
MCV: 99.2 fL (ref 80.0–100.0)
Monocytes Absolute: 0.3 10*3/uL (ref 0.1–1.0)
Monocytes Relative: 7 %
Neutro Abs: 2.5 10*3/uL (ref 1.7–7.7)
Neutrophils Relative %: 51 %
Platelets: 255 10*3/uL (ref 150–400)
RBC: 3.9 MIL/uL (ref 3.87–5.11)
RDW: 12.7 % (ref 11.5–15.5)
WBC: 5 10*3/uL (ref 4.0–10.5)
nRBC: 0 % (ref 0.0–0.2)

## 2018-09-29 LAB — SURGICAL PCR SCREEN
MRSA, PCR: NEGATIVE
Staphylococcus aureus: POSITIVE — AB

## 2018-09-29 LAB — APTT: aPTT: 29 seconds (ref 24–36)

## 2018-09-29 LAB — PROTIME-INR
INR: 0.9 (ref 0.8–1.2)
Prothrombin Time: 12.3 seconds (ref 11.4–15.2)

## 2018-09-30 LAB — URINE CULTURE: Culture: <10000 — AB

## 2018-10-05 ENCOUNTER — Other Ambulatory Visit (HOSPITAL_COMMUNITY)
Admission: RE | Admit: 2018-10-05 | Discharge: 2018-10-05 | Disposition: A | Discharge status: Home / Self Care | Payer: 59 | Source: Ambulatory Visit | Attending: Orthopedic Surgery | Admitting: Orthopedic Surgery

## 2018-10-05 ENCOUNTER — Encounter (HOSPITAL_COMMUNITY): Payer: 59

## 2018-10-05 DIAGNOSIS — Z1159 Encounter for screening for other viral diseases: Secondary | ICD-10-CM | POA: Insufficient documentation

## 2018-10-05 LAB — SARS CORONAVIRUS 2 (TAT 6-24 HRS): SARS Coronavirus 2: NEGATIVE

## 2018-10-08 MED ORDER — BUPIVACAINE LIPOSOME 1.3 % IJ SUSP
20.0000 mL | Freq: Once | INTRAMUSCULAR | Status: AC
  Filled 2018-10-08: qty 20

## 2018-10-09 ENCOUNTER — Other Ambulatory Visit: Payer: Self-pay

## 2018-10-09 ENCOUNTER — Encounter (HOSPITAL_COMMUNITY): Payer: Self-pay | Admitting: *Deleted

## 2018-10-09 ENCOUNTER — Inpatient Hospital Stay (HOSPITAL_COMMUNITY): Payer: 59 | Admitting: Physician Assistant

## 2018-10-09 ENCOUNTER — Inpatient Hospital Stay (HOSPITAL_COMMUNITY): Payer: 59 | Admitting: Anesthesiology

## 2018-10-09 ENCOUNTER — Encounter (HOSPITAL_COMMUNITY)
Admission: RE | Disposition: A | Discharge status: Home / Self Care | Payer: Self-pay | Source: Ambulatory Visit | Attending: Orthopedic Surgery

## 2018-10-09 ENCOUNTER — Observation Stay (HOSPITAL_COMMUNITY)
Admission: RE | Admit: 2018-10-09 | Discharge: 2018-10-10 | Disposition: A | Discharge status: Home / Self Care | Payer: 59 | Source: Ambulatory Visit | Attending: Orthopedic Surgery | Admitting: Orthopedic Surgery

## 2018-10-09 DIAGNOSIS — Z9049 Acquired absence of other specified parts of digestive tract: Secondary | ICD-10-CM | POA: Diagnosis not present

## 2018-10-09 DIAGNOSIS — R42 Dizziness and giddiness: Secondary | ICD-10-CM | POA: Diagnosis not present

## 2018-10-09 DIAGNOSIS — M21162 Varus deformity, not elsewhere classified, left knee: Secondary | ICD-10-CM | POA: Insufficient documentation

## 2018-10-09 DIAGNOSIS — Z880 Allergy status to penicillin: Secondary | ICD-10-CM | POA: Insufficient documentation

## 2018-10-09 DIAGNOSIS — E669 Obesity, unspecified: Secondary | ICD-10-CM | POA: Insufficient documentation

## 2018-10-09 DIAGNOSIS — M25762 Osteophyte, left knee: Secondary | ICD-10-CM | POA: Diagnosis not present

## 2018-10-09 DIAGNOSIS — R112 Nausea with vomiting, unspecified: Secondary | ICD-10-CM | POA: Diagnosis not present

## 2018-10-09 DIAGNOSIS — M1712 Unilateral primary osteoarthritis, left knee: Principal | ICD-10-CM | POA: Insufficient documentation

## 2018-10-09 DIAGNOSIS — Z791 Long term (current) use of non-steroidal anti-inflammatories (NSAID): Secondary | ICD-10-CM | POA: Insufficient documentation

## 2018-10-09 DIAGNOSIS — Z87891 Personal history of nicotine dependence: Secondary | ICD-10-CM | POA: Insufficient documentation

## 2018-10-09 DIAGNOSIS — M25462 Effusion, left knee: Secondary | ICD-10-CM | POA: Diagnosis not present

## 2018-10-09 DIAGNOSIS — Z6825 Body mass index (BMI) 25.0-25.9, adult: Secondary | ICD-10-CM | POA: Insufficient documentation

## 2018-10-09 HISTORY — PX: TOTAL KNEE ARTHROPLASTY: SHX125

## 2018-10-09 SURGERY — ARTHROPLASTY, KNEE, TOTAL
Anesthesia: Regional | Laterality: Left

## 2018-10-09 MED ORDER — DIPHENHYDRAMINE HCL 12.5 MG/5ML PO ELIX: 12.5000 mg | ORAL_SOLUTION | ORAL | Status: DC | PRN

## 2018-10-09 MED ORDER — MIDAZOLAM HCL 2 MG/2ML IJ SOLN
1.0000 mg | Freq: Once | INTRAMUSCULAR | Status: AC
  Administered 2018-10-09: 2 mg via INTRAVENOUS
  Filled 2018-10-09: qty 2

## 2018-10-09 MED ORDER — BUPIVACAINE-EPINEPHRINE (PF) 0.25% -1:200000 IJ SOLN
INTRAMUSCULAR | Status: AC
  Filled 2018-10-09: qty 30

## 2018-10-09 MED ORDER — BUPIVACAINE LIPOSOME 1.3 % IJ SUSP
INTRAMUSCULAR | Status: DC | PRN
  Administered 2018-10-09: 20 mL

## 2018-10-09 MED ORDER — POVIDONE-IODINE 10 % EX SWAB
2.0000 "application " | Freq: Once | CUTANEOUS | Status: AC
  Administered 2018-10-09: 2 via TOPICAL

## 2018-10-09 MED ORDER — POLYETHYLENE GLYCOL 3350 17 G PO PACK
17.0000 g | PACK | Freq: Two times a day (BID) | ORAL | Status: DC
  Administered 2018-10-09: 17 g via ORAL
  Filled 2018-10-09 (×2): qty 1

## 2018-10-09 MED ORDER — ACETAMINOPHEN 500 MG PO TABS
1000.0000 mg | ORAL_TABLET | Freq: Four times a day (QID) | ORAL | Status: DC
  Administered 2018-10-09 – 2018-10-10 (×3): 1000 mg via ORAL
  Filled 2018-10-09 (×2): qty 2

## 2018-10-09 MED ORDER — PROPOFOL 500 MG/50ML IV EMUL
INTRAVENOUS | Status: DC | PRN
  Administered 2018-10-09: 100 ug/kg/min via INTRAVENOUS

## 2018-10-09 MED ORDER — BUPIVACAINE-EPINEPHRINE (PF) 0.5% -1:200000 IJ SOLN
INTRAMUSCULAR | Status: DC | PRN
  Administered 2018-10-09: 20 mL via PERINEURAL

## 2018-10-09 MED ORDER — FENTANYL CITRATE (PF) 100 MCG/2ML IJ SOLN
50.0000 ug | Freq: Once | INTRAMUSCULAR | Status: AC
  Administered 2018-10-09: 50 ug via INTRAVENOUS
  Filled 2018-10-09: qty 2

## 2018-10-09 MED ORDER — DEXAMETHASONE SODIUM PHOSPHATE 10 MG/ML IJ SOLN
8.0000 mg | Freq: Once | INTRAMUSCULAR | Status: AC
  Administered 2018-10-09: 10:00:00 8 mg via INTRAVENOUS

## 2018-10-09 MED ORDER — CHLORHEXIDINE GLUCONATE 4 % EX LIQD
60.0000 mL | Freq: Once | CUTANEOUS | Status: AC
  Administered 2018-10-09: 4 via TOPICAL

## 2018-10-09 MED ORDER — BUPIVACAINE IN DEXTROSE 0.75-8.25 % IT SOLN
INTRATHECAL | Status: DC | PRN
  Administered 2018-10-09: 1.4 mL via INTRATHECAL

## 2018-10-09 MED ORDER — TRANEXAMIC ACID-NACL 1000-0.7 MG/100ML-% IV SOLN
1000.0000 mg | INTRAVENOUS | Status: AC
  Administered 2018-10-09: 1000 mg via INTRAVENOUS
  Filled 2018-10-09: qty 100

## 2018-10-09 MED ORDER — LACTATED RINGERS IV SOLN
INTRAVENOUS | Status: DC
  Administered 2018-10-09 (×2): via INTRAVENOUS

## 2018-10-09 MED ORDER — PHENYLEPHRINE 40 MCG/ML (10ML) SYRINGE FOR IV PUSH (FOR BLOOD PRESSURE SUPPORT)
PREFILLED_SYRINGE | INTRAVENOUS | Status: DC | PRN
  Administered 2018-10-09 (×7): 80 ug via INTRAVENOUS

## 2018-10-09 MED ORDER — PROPOFOL 10 MG/ML IV BOLUS
INTRAVENOUS | Status: AC
  Filled 2018-10-09: qty 40

## 2018-10-09 MED ORDER — EPHEDRINE SULFATE-NACL 50-0.9 MG/10ML-% IV SOSY
PREFILLED_SYRINGE | INTRAVENOUS | Status: DC | PRN
  Administered 2018-10-09: 10 mg via INTRAVENOUS

## 2018-10-09 MED ORDER — SODIUM CHLORIDE 0.9 % IJ SOLN
INTRAMUSCULAR | Status: DC | PRN
  Administered 2018-10-09: 50 mL

## 2018-10-09 MED ORDER — SODIUM CHLORIDE (PF) 0.9 % IJ SOLN
INTRAMUSCULAR | Status: AC
  Filled 2018-10-09: qty 50

## 2018-10-09 MED ORDER — PHENOL 1.4 % MT LIQD: 1.0000 | OROMUCOSAL | Status: DC | PRN

## 2018-10-09 MED ORDER — ONDANSETRON HCL 4 MG/2ML IJ SOLN
INTRAMUSCULAR | Status: AC
  Filled 2018-10-09: qty 2

## 2018-10-09 MED ORDER — CLONIDINE HCL (ANALGESIA) 100 MCG/ML EP SOLN
EPIDURAL | Status: DC | PRN
  Administered 2018-10-09: 100 ug

## 2018-10-09 MED ORDER — 0.9 % SODIUM CHLORIDE (POUR BTL) OPTIME
TOPICAL | Status: DC | PRN
  Administered 2018-10-09: 1000 mL

## 2018-10-09 MED ORDER — MENTHOL 3 MG MT LOZG: 1.0000 | LOZENGE | OROMUCOSAL | Status: DC | PRN

## 2018-10-09 MED ORDER — ACETAMINOPHEN 500 MG PO TABS
1000.0000 mg | ORAL_TABLET | Freq: Once | ORAL | Status: AC
  Administered 2018-10-09: 1000 mg via ORAL
  Filled 2018-10-09: qty 2

## 2018-10-09 MED ORDER — PROPOFOL 10 MG/ML IV BOLUS
INTRAVENOUS | Status: DC | PRN
  Administered 2018-10-09 (×3): 30 mg via INTRAVENOUS

## 2018-10-09 MED ORDER — PHENYLEPHRINE 40 MCG/ML (10ML) SYRINGE FOR IV PUSH (FOR BLOOD PRESSURE SUPPORT)
PREFILLED_SYRINGE | INTRAVENOUS | Status: AC
  Filled 2018-10-09: qty 10

## 2018-10-09 MED ORDER — ASPIRIN EC 325 MG PO TBEC
325.0000 mg | DELAYED_RELEASE_TABLET | Freq: Every day | ORAL | Status: DC
  Administered 2018-10-10: 325 mg via ORAL
  Filled 2018-10-09: qty 1

## 2018-10-09 MED ORDER — OXYCODONE HCL 5 MG PO TABS
5.0000 mg | ORAL_TABLET | ORAL | Status: DC | PRN
  Administered 2018-10-09 – 2018-10-10 (×5): 10 mg via ORAL
  Filled 2018-10-09 (×5): qty 2

## 2018-10-09 MED ORDER — FENTANYL CITRATE (PF) 100 MCG/2ML IJ SOLN
INTRAMUSCULAR | Status: AC
  Filled 2018-10-09: qty 2

## 2018-10-09 MED ORDER — POVIDONE-IODINE 7.5 % EX SOLN: Freq: Once | CUTANEOUS | Status: DC

## 2018-10-09 MED ORDER — POTASSIUM CHLORIDE IN NACL 20-0.9 MEQ/L-% IV SOLN
INTRAVENOUS | Status: DC
  Administered 2018-10-09 – 2018-10-10 (×2): via INTRAVENOUS
  Filled 2018-10-09 (×3): qty 1000

## 2018-10-09 MED ORDER — ALUM & MAG HYDROXIDE-SIMETH 200-200-20 MG/5ML PO SUSP: 30.0000 mL | ORAL | Status: DC | PRN

## 2018-10-09 MED ORDER — DEXAMETHASONE SODIUM PHOSPHATE 10 MG/ML IJ SOLN
10.0000 mg | Freq: Three times a day (TID) | INTRAMUSCULAR | Status: DC
  Administered 2018-10-09 – 2018-10-10 (×2): 10 mg via INTRAVENOUS
  Filled 2018-10-09 (×2): qty 1

## 2018-10-09 MED ORDER — HYDROMORPHONE HCL 1 MG/ML IJ SOLN
0.5000 mg | INTRAMUSCULAR | Status: DC | PRN
  Administered 2018-10-09: 0.5 mg via INTRAVENOUS
  Administered 2018-10-09: 1 mg via INTRAVENOUS
  Filled 2018-10-09 (×2): qty 1

## 2018-10-09 MED ORDER — DEXAMETHASONE SODIUM PHOSPHATE 10 MG/ML IJ SOLN
INTRAMUSCULAR | Status: AC
  Filled 2018-10-09: qty 1

## 2018-10-09 MED ORDER — DOCUSATE SODIUM 100 MG PO CAPS
100.0000 mg | ORAL_CAPSULE | Freq: Two times a day (BID) | ORAL | Status: DC
  Administered 2018-10-09: 100 mg via ORAL
  Filled 2018-10-09 (×2): qty 1

## 2018-10-09 MED ORDER — METOCLOPRAMIDE HCL 5 MG/ML IJ SOLN: 5.0000 mg | Freq: Three times a day (TID) | INTRAMUSCULAR | Status: DC | PRN

## 2018-10-09 MED ORDER — GABAPENTIN 300 MG PO CAPS
300.0000 mg | ORAL_CAPSULE | Freq: Every day | ORAL | Status: DC
  Administered 2018-10-09: 300 mg via ORAL
  Filled 2018-10-09: qty 1

## 2018-10-09 MED ORDER — PROPOFOL 10 MG/ML IV BOLUS
INTRAVENOUS | Status: AC
  Filled 2018-10-09: qty 20

## 2018-10-09 MED ORDER — ONDANSETRON HCL 4 MG/2ML IJ SOLN
4.0000 mg | Freq: Four times a day (QID) | INTRAMUSCULAR | Status: DC | PRN
  Administered 2018-10-09: 4 mg via INTRAVENOUS
  Filled 2018-10-09: qty 2

## 2018-10-09 MED ORDER — CEFUROXIME SODIUM 1.5 G IV SOLR
INTRAVENOUS | Status: DC | PRN
  Administered 2018-10-09: 1.5 g via INTRAVENOUS

## 2018-10-09 MED ORDER — WATER FOR IRRIGATION, STERILE IR SOLN
Status: DC | PRN
  Administered 2018-10-09: 2000 mL

## 2018-10-09 MED ORDER — METOCLOPRAMIDE HCL 5 MG PO TABS: 5.0000 mg | ORAL_TABLET | Freq: Three times a day (TID) | ORAL | Status: DC | PRN

## 2018-10-09 MED ORDER — FENTANYL CITRATE (PF) 100 MCG/2ML IJ SOLN
25.0000 ug | INTRAMUSCULAR | Status: DC | PRN
  Administered 2018-10-09 (×2): 50 ug via INTRAVENOUS

## 2018-10-09 MED ORDER — BUPIVACAINE-EPINEPHRINE 0.25% -1:200000 IJ SOLN
INTRAMUSCULAR | Status: DC | PRN
  Administered 2018-10-09: 30 mL

## 2018-10-09 MED ORDER — MIDAZOLAM HCL 2 MG/2ML IJ SOLN
INTRAMUSCULAR | Status: AC
  Filled 2018-10-09: qty 2

## 2018-10-09 MED ORDER — CEFAZOLIN SODIUM-DEXTROSE 2-4 GM/100ML-% IV SOLN
2.0000 g | Freq: Four times a day (QID) | INTRAVENOUS | Status: AC
  Administered 2018-10-09 (×2): 2 g via INTRAVENOUS
  Filled 2018-10-09 (×2): qty 100

## 2018-10-09 MED ORDER — ONDANSETRON HCL 4 MG PO TABS
4.0000 mg | ORAL_TABLET | Freq: Four times a day (QID) | ORAL | Status: DC | PRN
  Administered 2018-10-10: 4 mg via ORAL
  Filled 2018-10-09: qty 1

## 2018-10-09 MED ORDER — ONDANSETRON HCL 4 MG/2ML IJ SOLN
INTRAMUSCULAR | Status: DC | PRN
  Administered 2018-10-09: 4 mg via INTRAVENOUS

## 2018-10-09 MED ORDER — VANCOMYCIN HCL IN DEXTROSE 1-5 GM/200ML-% IV SOLN
1000.0000 mg | INTRAVENOUS | Status: AC
  Administered 2018-10-09: 1000 mg via INTRAVENOUS
  Filled 2018-10-09: qty 200

## 2018-10-09 SURGICAL SUPPLY — 66 items
ATTUNE MED DOME PAT 32 KNEE (Knees) ×1 IMPLANT
ATTUNE MED DOME PAT 32MM KNEE (Knees) ×1 IMPLANT
ATTUNE PS FEM LT SZ 4 CEM KNEE (Femur) ×2 IMPLANT
ATTUNE PSRP INSR SZ4 8 KNEE (Insert) ×1 IMPLANT
ATTUNE PSRP INSR SZ4 8MM KNEE (Insert) ×1 IMPLANT
BAG ZIPLOCK 12X15 (MISCELLANEOUS) ×3 IMPLANT
BASE TIBIAL ROT PLAT SZ 5 KNEE (Knees) IMPLANT
BLADE SAGITTAL 25.0X1.19X90 (BLADE) ×2 IMPLANT
BLADE SAGITTAL 25.0X1.19X90MM (BLADE) ×1
BLADE SAW SGTL 13X75X1.27 (BLADE) ×3 IMPLANT
BNDG ELASTIC 6X10 VLCR STRL LF (GAUZE/BANDAGES/DRESSINGS) ×3 IMPLANT
BOWL SMART MIX CTS (DISPOSABLE) ×3 IMPLANT
CEMENT HV SMART SET (Cement) ×6 IMPLANT
CHLORAPREP W/TINT 26 (MISCELLANEOUS) ×6 IMPLANT
CLOSURE STERI-STRIP 1/2X4 (GAUZE/BANDAGES/DRESSINGS) ×1
CLOSURE WOUND 1/2 X4 (GAUZE/BANDAGES/DRESSINGS) ×1
CLSR STERI-STRIP ANTIMIC 1/2X4 (GAUZE/BANDAGES/DRESSINGS) ×1 IMPLANT
COVER SURGICAL LIGHT HANDLE (MISCELLANEOUS) ×3 IMPLANT
COVER WAND RF STERILE (DRAPES) IMPLANT
CUFF TOURN SGL QUICK 34 (TOURNIQUET CUFF) ×2
CUFF TRNQT CYL 34X4.125X (TOURNIQUET CUFF) ×1 IMPLANT
DECANTER SPIKE VIAL GLASS SM (MISCELLANEOUS) ×6 IMPLANT
DRAPE ORTHO SPLIT 77X108 STRL (DRAPES) ×4
DRAPE SHEET LG 3/4 BI-LAMINATE (DRAPES) ×3 IMPLANT
DRAPE SURG ORHT 6 SPLT 77X108 (DRAPES) ×1 IMPLANT
DRAPE U-SHAPE 47X51 STRL (DRAPES) ×3 IMPLANT
DRSG AQUACEL AG ADV 3.5X10 (GAUZE/BANDAGES/DRESSINGS) ×3 IMPLANT
ELECT CAUTERY BLADE TIP 2.5 (TIP) ×3
ELECT PENCIL ROCKER SW 15FT (MISCELLANEOUS) ×2 IMPLANT
ELECT REM PT RETURN 15FT ADLT (MISCELLANEOUS) ×3 IMPLANT
ELECTRODE CAUTERY BLDE TIP 2.5 (TIP) ×1 IMPLANT
GLOVE BIO SURGEON STRL SZ7 (GLOVE) ×3 IMPLANT
GLOVE BIOGEL PI IND STRL 7.0 (GLOVE) ×1 IMPLANT
GLOVE BIOGEL PI IND STRL 7.5 (GLOVE) ×1 IMPLANT
GLOVE BIOGEL PI INDICATOR 7.0 (GLOVE) ×2
GLOVE BIOGEL PI INDICATOR 7.5 (GLOVE) ×2
GLOVE SS BIOGEL STRL SZ 7.5 (GLOVE) ×1 IMPLANT
GLOVE SUPERSENSE BIOGEL SZ 7.5 (GLOVE) ×2
GOWN STRL REUS W/ TWL LRG LVL3 (GOWN DISPOSABLE) ×2 IMPLANT
GOWN STRL REUS W/TWL LRG LVL3 (GOWN DISPOSABLE) ×4
HANDPIECE INTERPULSE COAX TIP (DISPOSABLE) ×2
HOLDER FOLEY CATH W/STRAP (MISCELLANEOUS) IMPLANT
HOOD PEEL AWAY FLYTE STAYCOOL (MISCELLANEOUS) ×9 IMPLANT
KIT TURNOVER KIT A (KITS) ×3 IMPLANT
MANIFOLD NEPTUNE II (INSTRUMENTS) ×3 IMPLANT
MARKER SKIN DUAL TIP RULER LAB (MISCELLANEOUS) ×3 IMPLANT
NEEDLE HYPO 22GX1.5 SAFETY (NEEDLE) ×3 IMPLANT
NS IRRIG 1000ML POUR BTL (IV SOLUTION) ×3 IMPLANT
PACK TOTAL KNEE CUSTOM (KITS) ×3 IMPLANT
PIN STEINMAN FIXATION KNEE (PIN) ×2 IMPLANT
PIN THREADED HEADED SIGMA (PIN) ×2 IMPLANT
PROTECTOR NERVE ULNAR (MISCELLANEOUS) ×3 IMPLANT
SET HNDPC FAN SPRY TIP SCT (DISPOSABLE) ×1 IMPLANT
STAPLER VISISTAT 35W (STAPLE) IMPLANT
STRIP CLOSURE SKIN 1/2X4 (GAUZE/BANDAGES/DRESSINGS) ×2 IMPLANT
SUT MNCRL AB 3-0 PS2 18 (SUTURE) ×3 IMPLANT
SUT VIC AB 0 CT1 36 (SUTURE) ×6 IMPLANT
SUT VIC AB 1 CT1 36 (SUTURE) ×3 IMPLANT
SUT VIC AB 2-0 CT1 27 (SUTURE) ×4
SUT VIC AB 2-0 CT1 TAPERPNT 27 (SUTURE) ×2 IMPLANT
SYR CONTROL 10ML LL (SYRINGE) ×6 IMPLANT
TIBIAL BASE ROT PLAT SZ 5 KNEE (Knees) ×3 IMPLANT
TRAY FOLEY MTR SLVR 14FR STAT (SET/KITS/TRAYS/PACK) ×3 IMPLANT
WATER STERILE IRR 1000ML POUR (IV SOLUTION) ×6 IMPLANT
YANKAUER SUCT BULB TIP 10FT TU (MISCELLANEOUS) ×3 IMPLANT
YANKAUER SUCT BULB TIP NO VENT (SUCTIONS) ×3 IMPLANT

## 2018-10-09 NOTE — Interval H&P Note (Signed)
History and Physical Interval Note:  10/09/2018 9:13 AM  [name]  has presented today for surgery, with the diagnosis of djd left knee.  The various methods of treatment have been discussed with the patient and family. After consideration of risks, benefits and other options for treatment, the patient has consented to  Procedure(s): TOTAL KNEE ARTHROPLASTY (Left) as a surgical intervention.  The patient's history has been reviewed, patient examined, no change in status, stable for surgery.  I have reviewed the patient's chart and labs.  Questions were answered to the patient's satisfaction.     Lorn Junes

## 2018-10-09 NOTE — Progress Notes (Signed)
Assisted Dr. Woodrum with left, ultrasound guided, adductor canal block. Side rails up, monitors on throughout procedure. See vital signs in flow sheet. Tolerated Procedure well.  

## 2018-10-09 NOTE — Anesthesia Preprocedure Evaluation (Addendum)
Anesthesia Evaluation  Patient identified by MRN, date of birth, ID band Patient awake    Reviewed: Allergy & Precautions, NPO status , Patient's Chart, lab work & pertinent test results  Airway Mallampati: II  TM Distance: >3 FB Neck ROM: Full    Dental no notable dental hx. (+) Teeth Intact, Dental Advisory Given   Pulmonary neg pulmonary ROS, former smoker,    Pulmonary exam normal breath sounds clear to auscultation       Cardiovascular negative cardio ROS Normal cardiovascular exam Rhythm:Regular Rate:Normal     Neuro/Psych negative neurological ROS  negative psych ROS   GI/Hepatic Neg liver ROS, GERD  ,  Endo/Other  negative endocrine ROS  Renal/GU negative Renal ROS  negative genitourinary   Musculoskeletal  (+) Arthritis ,   Abdominal   Peds  Hematology negative hematology ROS (+)   Anesthesia Other Findings   Reproductive/Obstetrics                            Anesthesia Physical Anesthesia Plan  ASA: II  Anesthesia Plan: Spinal and Regional   Post-op Pain Management:  Regional for Post-op pain   Induction:   PONV Risk Score and Plan: Treatment may vary due to age or medical condition, Ondansetron, Dexamethasone, Propofol infusion and Midazolam  Airway Management Planned: Natural Airway  Additional Equipment:   Intra-op Plan:   Post-operative Plan:   Informed Consent: I have reviewed the patients History and Physical, chart, labs and discussed the procedure including the risks, benefits and alternatives for the proposed anesthesia with the patient or authorized representative who has indicated his/her understanding and acceptance.     Dental advisory given  Plan Discussed with: CRNA  Anesthesia Plan Comments:         Anesthesia Quick Evaluation

## 2018-10-09 NOTE — Op Note (Signed)
MRN:     [id number] DOB/AGE:    1955/05/21 / 63 y.o.       OPERATIVE REPORT   DATE OF PROCEDURE:  10/09/2018      PREOPERATIVE DIAGNOSIS:   Primary Localized Osteoarthritis left Knee       Estimated body mass index is 25.88 kg/m as calculated from the following:   Height as of this encounter: 5\' 8"  (1.727 m).   Weight as of this encounter: 77.2 kg.                                                       POSTOPERATIVE DIAGNOSIS:   Same                                                                 PROCEDURE:  Procedure(s): TOTAL KNEE ARTHROPLASTY Using Depuy Attune RP implants #4 Femur, #5Tibia, 8mm  RP bearing, 32 Patella    SURGEON: Gwendolynn Merkey A. Thurston HoleWainer, MD   ASSISTANT: Julien GirtKirstin Shepperson, PA-C, present and scrubbed throughout the case, critical for retraction, instrumentation, and closure.  ANESTHESIA: Spinal with Adductor Nerve Block  TOURNIQUET TIME: 58 minutes   COMPLICATIONS:  None       SPECIMENS: None   INDICATIONS FOR PROCEDURE: The patient has djd of the knee with varus deformities, XR shows bone on bone arthritis. Patient has failed all conservative measures including anti-inflammatory medicines, narcotics, attempts at exercise and weight loss, cortisone injections and viscosupplementation.  Risks and benefits of surgery have been discussed, questions answered.    DESCRIPTION OF PROCEDURE: The patient identified by armband, received right adductor canal block and IV antibiotics, [facility]. Patient taken to the operating room, appropriate anesthetic monitors were attached. Spinal anesthesia induced with the patient in supine position, Foley catheter was inserted. Tourniquet applied high to the operative thigh. Lateral post and foot positioner applied to the table, the lower extremity was then prepped and draped in usual sterile fashion from the ankle to the tourniquet. Time-out procedure was performed. The limb was wrapped with an Esmarch bandage  and the tourniquet inflated to 365 mmHg.   We began the operation by making a 6cm anterior midline incision. Small bleeders in the skin and the subcutaneous tissue identified and cauterized. Transverse retinaculum was incised and reflected medially and a medial parapatellar arthrotomy was accomplished. the patella was everted and theprepatellar fat pad resected. The superficial medial collateral ligament was then elevated from anterior to posterior along the proximal flare of the tibia and anterior half of the menisci resected. The knee was hyperflexed exposing bone on bone arthritis. Peripheral and notch osteophytes as well as the cruciate ligaments were then resected. We continued to work our way around posteriorly along the proximal tibia, and externally rotated the tibia subluxing it out from underneath the femur. A McHale retractor was placed through the notch and a lateral Hohmann retractor placed, and an external tibial guide was placed.  The tibial cutting guide was pinned into place allowing resection of 4 mm of bone medially and about 6 mm of bone laterally because of her  varus deformity.   Satisfied with the tibial resection, we then entered the distal femur 2 mm anterior to the PCL origin with the intramedullary guide rod and applied the distal femoral cutting guide set at 22mm, with 5 degrees of valgus. This was pinned along the epicondylar axis. At this point, the distal femoral cut was accomplished without difficulty. We then sized for a 4 femoral component and pinned the guide in 3 degrees of external rotation.The chamfer cutting guide was pinned into place. The anterior, posterior, and chamfer cuts were accomplished without difficulty followed by the  RP box cutting guide and the box cut. We also removed posterior osteophytes from the posterior femoral condyles. At this time, the knee was brought into full extension. We checked our extension and flexion gaps and found them symmetric at 8.  The  patella thickness measured at 35m m. We set the cutting guide at 15 and removed the posterior patella sized for 32 button and drilled the lollipop. The knee was then once again hyperflexed exposing the proximal tibia. We sized for a # 5 tibial base plate, applied the smokestack and the conical reamer followed by the the Delta fin keel punch. We then hammered into place the  RP trial femoral component, inserted a trial bearing, trial patellar button, and took the knee through range of motion from 0-130 degrees. No thumb pressure was required for patellar tracking.   At this point, all trial components were removed, a double batch of DePuy HV cement with Zinacef 1.5gm due to questionable PCN allergy was mixed and applied to all bony metallic mating surfaces. In order, we hammered into place the tibial tray and removed excess cement, the femoral component and removed excess cement, a 8 mm  RP bearing was inserted, and the knee brought to full extension with compression. The patellar button was clamped into place, and excess cement removed. While the cement cured the wound was irrigated out with normal saline solution pulse lavage, and exparel was injected throughout the knee. Ligament stability and patellar tracking were checked and found to be excellent..   The parapatellar arthrotomy was closed with  #1 Vicryl suture. The subcutaneous tissue with 0 and 2-0 undyed Vicryl suture, and 4-0 Monocryl.. A dressing of Aquaseal, 4 x 4, dressing sponges, Webril, and Ace wrap applied. Needle and sponge count were correct times 2.The patient awakened, extubated, and taken to recovery room without difficulty. Vascular status was normal, pulses 2+ and symmetric.    Lorn Junes 06/13/2017, 8:56 AM

## 2018-10-09 NOTE — Anesthesia Postprocedure Evaluation (Signed)
Anesthesia Post Note  Patient: [name]  Procedure(s) Performed: TOTAL KNEE ARTHROPLASTY (Left )     Patient location during evaluation: PACU Anesthesia Type: Regional and Spinal Level of consciousness: oriented and awake and alert Pain management: pain level controlled Vital Signs Assessment: post-procedure vital signs reviewed and stable Respiratory status: spontaneous breathing, respiratory function stable and patient connected to nasal cannula oxygen Cardiovascular status: blood pressure returned to baseline and stable Postop Assessment: no headache, no backache and no apparent nausea or vomiting Anesthetic complications: no    Last Vitals:  Vitals:   10/09/18 1245 10/09/18 1300  BP: 125/66 120/73  Pulse: (!) 49 (!) 55  Resp: (!) 8 14  Temp:    SpO2: 98% 98%    Last Pain:  Vitals:   10/09/18 1344  TempSrc:   PainSc: 8                  Akemi Overholser L Audry Pecina

## 2018-10-09 NOTE — Transfer of Care (Signed)
Immediate Anesthesia Transfer of Care Note  Patient: [name]  Procedure(s) Performed: TOTAL KNEE ARTHROPLASTY (Left )  Patient Location: PACU  Anesthesia Type:Spinal and MAC combined with regional for post-op pain  Level of Consciousness: awake, sedated and patient cooperative  Airway & Oxygen Therapy: Patient connected to face mask oxygen  Post-op Assessment: Report given to RN and Post -op Vital signs reviewed and stable  Post vital signs: stable  Last Vitals:  Vitals Value Taken Time  BP 110/60 10/09/18 1200  Temp    Pulse    Resp 12 10/09/18 1200  SpO2    Vitals shown include unvalidated device data.  Last Pain:  Vitals:   10/09/18 0741  TempSrc:   PainSc: 0-No pain         Complications: No apparent anesthesia complications

## 2018-10-09 NOTE — Anesthesia Procedure Notes (Signed)
Anesthesia Regional Block: Adductor canal block   Pre-Anesthetic Checklist: ,, timeout performed, Correct Patient, Correct Site, Correct Laterality, Correct Procedure, Correct Position, site marked, Risks and benefits discussed,  Surgical consent,  Pre-op evaluation,  At surgeon's request and post-op pain management  Laterality: Left  Prep: Maximum Sterile Barrier Precautions used, chloraprep       Needles:  Injection technique: Single-shot  Needle Type: Echogenic Stimulator Needle     Needle Length: 9cm  Needle Gauge: 22     Additional Needles:   Procedures:,,,, ultrasound used (permanent image in chart),,,,  Narrative:  Start time: 10/09/2018 9:00 AM End time: 10/09/2018 9:10 AM Injection made incrementally with aspirations every 5 mL.  Performed by: Personally  Anesthesiologist: Freddrick March, MD  Additional Notes: Monitors applied. No increased pain on injection. No increased resistance to injection. Injection made in 5cc increments. Good needle visualization. Patient tolerated procedure well.

## 2018-10-09 NOTE — Evaluation (Addendum)
Physical Therapy Evaluation Patient Details Name: [name] MRN: [id number] DOB: 1955/11/10 Today's Date: 10/09/2018   History of Present Illness  63 yo female s/p L TKA 10/09/18  Clinical Impression  On eval POD 0, pt was Min assist +2 for safety for mobility. She walked ~5 feet before getting sweaty, nauseous, and lightheaded. BP 130/84 once settled back in recliner. Deferred further ambulation 2* symptoms. Will continue to follow and progress activity as tolerated. D/C plan is home with HHPT f/u    Follow Up Recommendations Follow surgeon's recommendation for DC plan and follow-up therapies(HHPT)    Equipment Recommendations  None recommended by PT    Recommendations for Other Services       Precautions / Restrictions Precautions Precautions: Fall;Knee Restrictions Weight Bearing Restrictions: No LLE Weight Bearing: Weight bearing as tolerated      Mobility  Bed Mobility Overal bed mobility: Needs Assistance Bed Mobility: Supine to Sit     Supine to sit: Min guard;HOB elevated     General bed mobility comments: close guard for safety.  Transfers Overall transfer level: Needs assistance Equipment used: Rolling walker (2 wheeled) Transfers: Sit to/from Stand Sit to Stand: Min assist         General transfer comment: Assist to rise, stabilize, control descent. VCs safety, technique, hand placement  Ambulation/Gait Ambulation/Gait assistance: Min assist;+2 safety/equipment Gait Distance (Feet): 5 Feet Assistive device: Rolling walker (2 wheeled) Gait Pattern/deviations: Step-to pattern     General Gait Details: VCs safety, technique, sequence. Pt became lightheaded, sweaty, and mildy nauseous after taking a few steps. Deferred further ambulation for safety reasons.  Stairs            Wheelchair Mobility    Modified Rankin (Stroke Patients Only)       Balance Overall balance assessment: Needs assistance         Standing balance support:  Bilateral upper extremity supported Standing balance-Leahy Scale: Poor                               Pertinent Vitals/Pain Pain Assessment: 0-10 Pain Score: 6  Pain Location: L knee Pain Descriptors / Indicators: Aching;Sore Pain Intervention(s): Ice applied;Repositioned;Limited activity within patient's tolerance    Home Living Family/patient expects to be discharged to:: Private residence Living Arrangements: Spouse/significant other Available Help at Discharge: Family Type of Home: House Home Access: Stairs to enter Entrance Stairs-Rails: None Entrance Stairs-Number of Steps: 1 Home Layout: Bed/bath upstairs;Two level Home Equipment: Walker - 2 wheels;Cane - single point;Bedside commode      Prior Function Level of Independence: Independent               Hand Dominance        Extremity/Trunk Assessment   Upper Extremity Assessment Upper Extremity Assessment: Overall WFL for tasks assessed    Lower Extremity Assessment Lower Extremity Assessment: (post op weakness 2* TKA)    Cervical / Trunk Assessment Cervical / Trunk Assessment: Normal  Communication   Communication: No difficulties  Cognition Arousal/Alertness: Awake/alert Behavior During Therapy: WFL for tasks assessed/performed Overall Cognitive Status: Within Functional Limits for tasks assessed                                        General Comments      Exercises     Assessment/Plan    PT Assessment  Patient needs continued PT services  PT Problem List Decreased strength;[name];Decreased activity tolerance;Decreased balance;[name];Pain       PT Treatment Interventions DME instruction;Gait training;Therapeutic exercise;Therapeutic activities;Patient/family education;Functional mobility training;Balance training;Stair training    PT Goals (Current goals can be found in the Care Plan section)  Acute Rehab PT  Goals Patient Stated Goal: regain PLOF PT Goal Formulation: With patient Time For Goal Achievement: 10/23/18 Potential to Achieve Goals: Good    Frequency 7X/week   Barriers to discharge        Co-evaluation               AM-PAC PT "6 Clicks" Mobility  Outcome Measure Help needed turning from your back to your side while in a flat bed without using bedrails?: A Little Help needed moving from lying on your back to sitting on the side of a flat bed without using bedrails?: A Little Help needed moving to and from a bed to a chair (including a wheelchair)?: A Little Help needed standing up from a chair using your arms (e.g., wheelchair or bedside chair)?: A Little Help needed to walk in hospital room?: A Little Help needed climbing 3-5 steps with a railing? : A Little 6 Click Score: 18    End of Session Equipment Utilized During Treatment: Gait belt Activity Tolerance: (limited by lightheadedness, sweating, nausea) Patient left: in chair;with call bell/phone within reach   PT Visit Diagnosis: Pain;Other abnormalities of gait and mobility (R26.89) Pain - Right/Left: Left Pain - part of body: Knee    Time: [id number] PT Time Calculation (min) (ACUTE ONLY): 12 min   Charges:   PT Evaluation $PT Eval Low Complexity: 1 Low           Rebeca AlertJannie Eduardo Honor, PT Acute Rehabilitation Services Pager: [phone] Office: [phone]

## 2018-10-09 NOTE — Anesthesia Procedure Notes (Signed)
Spinal  Patient location during procedure: OR Start time: 10/09/2018 10:00 AM End time: 10/09/2018 10:10 AM Staffing Anesthesiologist: Freddrick March, MD Performed: anesthesiologist  Preanesthetic Checklist Completed: patient identified, surgical consent, pre-op evaluation, timeout performed, IV checked, risks and benefits discussed and monitors and equipment checked Spinal Block Patient position: sitting Prep: site prepped and draped and DuraPrep Patient monitoring: cardiac monitor, continuous pulse ox and blood pressure Approach: midline Location: L3-4 Injection technique: single-shot Needle Needle type: Pencan  Needle gauge: 24 G Needle length: 9 cm Assessment Sensory level: T6 Additional Notes Functioning IV was confirmed and monitors were applied. Sterile prep and drape, including hand hygiene and sterile gloves were used. The patient was positioned and the spine was prepped. The skin was anesthetized with lidocaine.  Free flow of clear CSF was obtained prior to injecting local anesthetic into the CSF.  The spinal needle aspirated freely following injection.  The needle was carefully withdrawn.  The patient tolerated the procedure well.

## 2018-10-10 ENCOUNTER — Encounter (HOSPITAL_COMMUNITY): Payer: Self-pay | Admitting: Orthopedic Surgery

## 2018-10-10 DIAGNOSIS — M1712 Unilateral primary osteoarthritis, left knee: Secondary | ICD-10-CM | POA: Diagnosis not present

## 2018-10-10 LAB — CBC
HCT: 33.9 % — ABNORMAL LOW (ref 36.0–46.0)
Hemoglobin: 10.7 g/dL — ABNORMAL LOW (ref 12.0–15.0)
MCH: 31.8 pg (ref 26.0–34.0)
MCHC: 31.6 g/dL (ref 30.0–36.0)
MCV: 100.6 fL — ABNORMAL HIGH (ref 80.0–100.0)
Platelets: 230 10*3/uL (ref 150–400)
RBC: 3.37 MIL/uL — ABNORMAL LOW (ref 3.87–5.11)
RDW: 12.2 % (ref 11.5–15.5)
WBC: 10.9 10*3/uL — ABNORMAL HIGH (ref 4.0–10.5)
nRBC: 0 % (ref 0.0–0.2)

## 2018-10-10 LAB — BASIC METABOLIC PANEL
Anion gap: 12 (ref 5–15)
BUN: 12 mg/dL (ref 8–23)
CO2: 21 mmol/L — ABNORMAL LOW (ref 22–32)
Calcium: 8.6 mg/dL — ABNORMAL LOW (ref 8.9–10.3)
Chloride: 102 mmol/L (ref 98–111)
Creatinine, Ser: 0.84 mg/dL (ref 0.44–1.00)
GFR calc Af Amer: >60 mL/min (ref >60)
GFR calc non Af Amer: >60 mL/min (ref >60)
Glucose, Bld: 171 mg/dL — ABNORMAL HIGH (ref 70–99)
Potassium: 4.6 mmol/L (ref 3.5–5.1)
Sodium: 135 mmol/L (ref 135–145)

## 2018-10-10 LAB — URINE CULTURE: Culture: NO GROWTH

## 2018-10-10 MED ORDER — POLYETHYLENE GLYCOL 3350 17 G PO PACK: PACK | ORAL | 0 refills | Status: DC

## 2018-10-10 MED ORDER — OXYCODONE HCL 5 MG PO TABS: ORAL_TABLET | ORAL | 0 refills | Status: DC

## 2018-10-10 MED ORDER — PROMETHAZINE HCL 25 MG/ML IJ SOLN
12.5000 mg | Freq: Four times a day (QID) | INTRAMUSCULAR | Status: DC | PRN
  Administered 2018-10-10: 12.5 mg via INTRAVENOUS
  Filled 2018-10-10: qty 1

## 2018-10-10 MED ORDER — DOCUSATE SODIUM 100 MG PO CAPS: ORAL_CAPSULE | ORAL | 0 refills | Status: DC

## 2018-10-10 MED ORDER — GABAPENTIN 300 MG PO CAPS: ORAL_CAPSULE | ORAL | 0 refills | Status: DC

## 2018-10-10 MED ORDER — APIXABAN 2.5 MG PO TABS: ORAL_TABLET | ORAL | 0 refills | Status: DC

## 2018-10-10 MED ORDER — SODIUM CHLORIDE 0.9 % IV BOLUS
500.0000 mL | Freq: Once | INTRAVENOUS | Status: AC
  Administered 2018-10-10: 500 mL via INTRAVENOUS

## 2018-10-10 MED ORDER — PROMETHAZINE HCL 12.5 MG PO TABS: 12.5000 mg | ORAL_TABLET | Freq: Four times a day (QID) | ORAL | 0 refills | Status: DC | PRN

## 2018-10-10 NOTE — Progress Notes (Signed)
Subjective: 1 Day Post-Op Procedure(s) (LRB): TOTAL KNEE ARTHROPLASTY (Left) Patient reports pain as controlled but significant dizziness and nausea  Objective: Vital signs in last 24 hours: Temp:  [97.5 F (36.4 C)-98.5 F (36.9 C)] 98 F (36.7 C) (07/21 0521) Pulse Rate:  [49-81] 67 (07/21 0521) Resp:  [6-17] 16 (07/21 0521) BP: (92-141)/(52-76) 101/59 (07/21 0521) SpO2:  [95 %-100 %] 98 % (07/21 0521)  Intake/Output from previous day: 07/20 0701 - 07/21 0700 In: 3734.9 [P.O.:180; I.V.:3254.9; IV Piggyback:200] Out: 3525 [Urine:3400; Emesis/NG output:100; Blood:25] Intake/Output this shift: No intake/output data recorded.  Recent Labs    10/10/18 0314  HGB 10.7*   Recent Labs    10/10/18 0314  WBC 10.9*  RBC 3.37*  HCT 33.9*  PLT 230   Recent Labs    10/10/18 0314  NA 135  K 4.6  CL 102  CO2 21*  BUN 12  CREATININE 0.84  GLUCOSE 171*  CALCIUM 8.6*   No results for input(s): LABPT, INR in the last 72 hours.  ABD soft Neurovascular intact Incision: scant drainage   Assessment/Plan: 1 Day Post-Op Procedure(s) (LRB): TOTAL KNEE ARTHROPLASTY (Left) Advance diet Up with therapy   Patient is having significant dizziness and nausea continuing this morning.  She only made it half way around the unit.  Discharge is questionable today due to nausea and dizziness.  I have ordered fluid boluses times two.  I have added phenergan to her nausea meds.     Patient's anticipated LOS is less than 2 midnights, meeting these requirements: - Younger than 46 - Lives within 1 hour of care - Has a competent adult at home to recover with post-op recover - NO history of  - Chronic pain requiring opiods  - Diabetes  - Coronary Artery Disease  - Heart failure  - Heart attack  - Stroke  - DVT/VTE  - Cardiac arrhythmia  - Respiratory Failure/COPD  - Renal failure  - Anemia  - Advanced Liver disease       Alizea Pell J Kasarah Sitts 10/10/2018, 8:59 AM

## 2018-10-10 NOTE — Plan of Care (Signed)
Discussed with patient. All needs met at this time 

## 2018-10-10 NOTE — TOC Transition Note (Addendum)
Transition of Care [facility]) - CM/SW Discharge Note   Patient Details  Name: [name] MRN: [id number] Date of Birth: Nov 06, 1955  Transition of Care [facility]) CM/SW Contact:  Lia Hopping, [place] Phone Number: 10/10/2018, 9:57 AM   Clinical Narrative:    Kela Millin for physical therapy  Has ( RW and 3 IN 1),    Final next level of care: [facility](Pre[facility]anged [facility]) Barriers to Discharge: No Barriers Identified   Patient Goals and CMS Choice Patient states their goals for this hospitalization and ongoing recovery are:: Home w/ therapy      Discharge Placement  Home                      Discharge Plan and Services                                     Social Determinants of Health (SDOH) Interventions     Readmission Risk Interventions No flowsheet data found.

## 2018-10-10 NOTE — Progress Notes (Signed)
Physical Therapy Treatment Patient Details Name: [name] MRN: [id number] DOB: 27-Oct-1955 Today's Date: 10/10/2018    History of Present Illness 63 yo female s/p L TKA 10/09/18    PT Comments    Pt feels much better. Reviewed/practiced exercises, gait training, and stair training. Issued HEP for pt to perform at home until HHPT begins. All education completed. Okay to d/c from PT standpoint-made RN aware.    Follow Up Recommendations  Follow surgeon's recommendation for DC plan and follow-up therapies     Equipment Recommendations  None recommended by PT    Recommendations for Other Services       Precautions / Restrictions Precautions Precautions: Fall;Knee Restrictions Weight Bearing Restrictions: No LLE Weight Bearing: Weight bearing as tolerated    Mobility  Bed Mobility Overal bed mobility: Modified Independent                Transfers Overall transfer level: Needs assistance Equipment used: Rolling walker (2 wheeled) Transfers: Sit to/from Stand Sit to Stand: Supervision         General transfer comment: for safety. VCs safety, technique, hand placement  Ambulation/Gait Ambulation/Gait assistance: Min guard Gait Distance (Feet): 200 Feet Assistive device: Rolling walker (2 wheeled) Gait Pattern/deviations: Step-through pattern;Decreased stride length     General Gait Details: close guard for safety. Fair gait speed. Pt denied lightheadedness/dizziness and nausea.   Stairs Stairs: Yes Stairs assistance: Min guard Stair Management: One rail Right;Forwards;Step to pattern Number of Stairs: 2 General stair comments: close guard for safety. VCs safety, technique, sequence.   Wheelchair Mobility    Modified Rankin (Stroke Patients Only)       Balance Overall balance assessment: Mild deficits observed, not formally tested                                          Cognition Arousal/Alertness: Awake/alert Behavior  During Therapy: WFL for tasks assessed/performed Overall Cognitive Status: Within Functional Limits for tasks assessed                                        Exercises Total Joint Exercises Ankle Circles/Pumps: AROM;Both;10 reps;Supine Quad Sets: AROM;Both;10 reps;Supine Heel Slides: AROM;AAROM;Left;10 reps;Supine Hip ABduction/ADduction: AROM;Left;10 reps;Supine Straight Leg Raises: AROM;Left;10 reps;Supine Goniometric ROM: ~5-90    General Comments        Pertinent Vitals/Pain Pain Assessment: 0-10 Pain Score: 5  Pain Descriptors / Indicators: Aching;Sore Pain Intervention(s): Monitored during session;Ice applied    Home Living                      Prior Function            PT Goals (current goals can now be found in the care plan section) Progress towards PT goals: Progressing toward goals    Frequency    7X/week      PT Plan Current plan remains appropriate    Co-evaluation              AM-PAC PT "6 Clicks" Mobility   Outcome Measure  Help needed turning from your back to your side while in a flat bed without using bedrails?: None Help needed moving from lying on your back to sitting on the side of a flat bed without using bedrails?: None Help needed  moving to and from a bed to a chair (including a wheelchair)?: A Little Help needed standing up from a chair using your arms (e.g., wheelchair or bedside chair)?: A Little Help needed to walk in hospital room?: A Little Help needed climbing 3-5 steps with a railing? : A Little 6 Click Score: 20    End of Session Equipment Utilized During Treatment: Gait belt Activity Tolerance: Patient tolerated treatment well Patient left: in bed;with call bell/phone within reach   PT Visit Diagnosis: Other abnormalities of gait and mobility (R26.89)     Time: 1031-5945 PT Time Calculation (min) (ACUTE ONLY): 28 min  Charges:  $Gait Training: 8-22 mins $Therapeutic Exercise: 8-22  mins                        Weston Anna, [place] Pager: [phone] Office: [phone]

## 2018-10-10 NOTE — Progress Notes (Signed)
RN reviewed discharge instructions with patient. All questions answered.   Paperwork reviewed. Prescriptions sent to pharmacy by PA.   NT rolled patient down with all belongings to family car.

## 2018-10-10 NOTE — Discharge Summary (Signed)
Patient ID: [name] MRN: [id number] DOB/AGE: 1955-12-21 63 y.o.  Admit date: 10/09/2018 Discharge date: 10/10/2018  Admission Diagnoses:  Active Problems:   Primary localized osteoarthritis of left knee   Discharge Diagnoses:  Same  Past Medical History:  Diagnosis Date  . Arthritis   . GERD (gastroesophageal reflux disease)    resolved with cholecystectomy  . SNHL (sensorineural hearing loss)   . Tubal pregnancy     Surgeries: Procedure(s): TOTAL KNEE ARTHROPLASTY on 10/09/2018   Consultants:   Discharged Condition: Improved  Hospital Course: [name] is an 63 y.o. female who was admitted 10/09/2018 for operative treatment of<principal problem not specified>. Patient has severe unremitting pain that affects sleep, daily activities, and work/hobbies. After pre-op clearance the patient was taken to the operating room on 10/09/2018 and underwent  Procedure(s): TOTAL KNEE ARTHROPLASTY.    Patient was given perioperative antibiotics:  Anti-infectives (From admission, onward)   Start     Dose/Rate Route Frequency Ordered Stop   10/09/18 1500  ceFAZolin (ANCEF) IVPB 2g/100 mL premix     2 g 200 mL/hr over 30 Minutes Intravenous Every 6 hours 10/09/18 1330 10/09/18 2132   10/09/18 1059  cefUROXime (ZINACEF) injection  Status:  Discontinued       As needed 10/09/18 1101 10/09/18 1156   10/09/18 0745  vancomycin (VANCOCIN) IVPB 1000 mg/200 mL premix     1,000 mg 200 mL/hr over 60 Minutes Intravenous On call to O.R. 10/09/18 0738 10/09/18 1011       Patient was given sequential compression devices, early ambulation, and chemoprophylaxis to prevent DVT.  She had significant difficulty with nausea vomitting and dizziness post op day zero and the morning of post op day one.  She improved with IV phenergan and 2 boluses of 500 cc.  She is significantly improved this afternoon and ready for discharge  Patient benefited maximally from hospital stay and there were no  complications.    Recent vital signs:  Patient Vitals for the past 24 hrs:  BP Temp Temp src Pulse Resp SpO2  10/10/18 1315 118/61 98 F (36.7 C) - 76 15 98 %  10/10/18 0959 101/61 97.6 F (36.4 C) Oral 72 14 97 %  10/10/18 0521 (!) 101/59 98 F (36.7 C) - 67 16 98 %  10/10/18 0147 119/60 (!) 97.5 F (36.4 C) - 60 16 97 %  10/09/18 2133 (!) 141/76 98.5 F (36.9 C) Oral 62 16 100 %  10/09/18 1643 118/72 98.2 F (36.8 C) - 75 16 100 %  10/09/18 1527 122/72 97.9 F (36.6 C) - 68 16 100 %     Recent laboratory studies:  Recent Labs    10/10/18 0314  WBC 10.9*  HGB 10.7*  HCT 33.9*  PLT 230  NA 135  K 4.6  CL 102  CO2 21*  BUN 12  CREATININE 0.84  GLUCOSE 171*  CALCIUM 8.6*     Discharge Medications:   Allergies as of 10/10/2018      Reactions   Amoxicillin-pot Clavulanate Rash      Medication List    STOP taking these medications   meloxicam 15 MG tablet Commonly known as: MOBIC     TAKE these medications   apixaban 2.5 MG Tabs tablet Commonly known as: Eliquis 1 tab bid for 30 days to prevent blood clots.  When finished start Aspirin 81 mg daily   docusate sodium 100 MG capsule Commonly known as: COLACE 1 tab 2 times a day while on  narcotics.  STOOL SOFTENER   estradiol 0.1 MG/GM vaginal cream Commonly known as: ESTRACE Place 0.25 Applicatorfuls vaginally 2 (two) times a week. Notes to patient: Home regimen   fluticasone 50 MCG/ACT nasal spray Commonly known as: FLONASE Place 2 sprays into both nostrils daily as needed for allergies.   gabapentin 300 MG capsule Commonly known as: NEURONTIN 1 po qhs for nerve pain Notes to patient: At bedtime as needed for pain   ketotifen 0.025 % ophthalmic solution Commonly known as: ZADITOR Place 1 drop into both eyes daily as needed (for dry eyes).   oxyCODONE 5 MG immediate release tablet Commonly known as: Oxy IR/ROXICODONE 1 po q 4 hrs prn pain   polyethylene glycol 17 g packet Commonly known as:  MIRALAX / GLYCOLAX 17grams in 6 oz of something to drink twice a day until bowel movement.  LAXITIVE.  Restart if two days since last bowel movement   promethazine 12.5 MG tablet Commonly known as: PHENERGAN Take 1 tablet (12.5 mg total) by mouth every 6 (six) hours as needed for nausea or vomiting.   Systane Ultra 0.4-0.3 % Soln Generic drug: Polyethyl Glycol-Propyl Glycol Place 2 drops into both eyes daily as needed (for dry eyes).            Discharge Care Instructions  (From admission, onward)         Start     Ordered   10/10/18 0000  Change dressing    Comments: DO NOT REMOVE BANDAGE OVER SURGICAL INCISION.  WASH WHOLE LEG INCLUDING OVER THE WATERPROOF BANDAGE WITH SOAP AND WATER EVERY DAY.   10/10/18 1502          Diagnostic Studies: No results found.  Disposition: Discharge disposition: 01-Home or Self Care       Discharge Instructions    CPM   Complete by: As directed    Continuous passive motion machine (CPM):      Use the CPM from 0 to 90 for 6 hours per day.       You may break it up into 2 or 3 sessions per day.      Use CPM for 2 weeks or until you are told to stop.   Call MD / Call 911   Complete by: As directed    If you experience chest pain or shortness of breath, CALL 911 and be transported to the hospital emergency room.  If you develope a fever above 101 F, pus (white drainage) or increased drainage or redness at the wound, or calf pain, call your surgeon's office.   Change dressing   Complete by: As directed    DO NOT REMOVE BANDAGE OVER SURGICAL INCISION.  WASH WHOLE LEG INCLUDING OVER THE WATERPROOF BANDAGE WITH SOAP AND WATER EVERY DAY.   Constipation Prevention   Complete by: As directed    Drink plenty of fluids.  Prune juice may be helpful.  You may use a stool softener, such as Colace (over the counter) 100 mg twice a day.  Use MiraLax (over the counter) for constipation as needed.   Diet - low sodium heart healthy   Complete by: As  directed    Discharge instructions   Complete by: As directed    INSTRUCTIONS AFTER JOINT REPLACEMENT   Remove items at home which could result in a fall. This includes throw rugs or furniture in walking pathways ICE to the affected joint every three hours while awake for 30 minutes at a time, for at least  the first 3-5 days, and then as needed for pain and swelling.  Continue to use ice for pain and swelling. You may notice swelling that will progress down to the foot and ankle.  This is normal after surgery.  Elevate your leg when you are not up walking on it.   Continue to use the breathing machine you got in the hospital (incentive spirometer) which will help keep your temperature down.  It is common for your temperature to cycle up and down following surgery, especially at night when you are not up moving around and exerting yourself.  The breathing machine keeps your lungs expanded and your temperature down.   DIET:  As you were doing prior to hospitalization, we recommend a well-balanced diet.  DRESSING / WOUND CARE / SHOWERING  Keep the surgical dressing until follow up.  The dressing is water proof, so you can shower without any extra covering.  IF THE DRESSING FALLS OFF or the wound gets wet inside, change the dressing with sterile gauze.  Please use good hand washing techniques before changing the dressing.  Do not use any lotions or creams on the incision until instructed by your surgeon.    ACTIVITY  Increase activity slowly as tolerated, but follow the weight bearing instructions below.   No driving for 6 weeks or until further direction given by your physician.  You cannot drive while taking narcotics.  No lifting or carrying greater than 10 lbs. until further directed by your surgeon. Avoid periods of inactivity such as sitting longer than an hour when not asleep. This helps prevent blood clots.  You may return to work once you are authorized by your doctor.     WEIGHT  BEARING   Weight bearing as tolerated with assist device (walker, cane, etc) as directed, use it as long as suggested by your surgeon or therapist, typically at least 2-3 weeks.   EXERCISES  Results after joint replacement surgery are often greatly improved when you follow the exercise, range of motion and muscle strengthening exercises prescribed by your doctor. Safety measures are also important to protect the joint from further injury. Any time any of these exercises cause you to have increased pain or swelling, decrease what you are doing until you are comfortable again and then slowly increase them. If you have problems or questions, call your caregiver or physical therapist for advice.   Rehabilitation is important following a joint replacement. After just a few days of immobilization, the muscles of the leg can become weakened and shrink (atrophy).  These exercises are designed to build up the tone and strength of the thigh and leg muscles and to improve motion. Often times heat used for twenty to thirty minutes before working out will loosen up your tissues and help with improving the range of motion but do not use heat for the first two weeks following surgery (sometimes heat can increase post-operative swelling).   These exercises can be done on a training (exercise) mat, on the floor, on a table or on a bed. Use whatever works the best and is most comfortable for you.    Use music or television while you are exercising so that the exercises are a pleasant break in your day. This will make your life better with the exercises acting as a break in your routine that you can look forward to.   Perform all exercises about fifteen times, three times per day or as directed.  You should exercise both the  operative leg and the other leg as well.   Exercises include:  Quad Sets - Tighten up the muscle on the front of the thigh (Quad) and hold for 5-10 seconds.   Straight Leg Raises - With your knee  straight (if you were given a brace, keep it on), lift the leg to 60 degrees, hold for 3 seconds, and slowly lower the leg.  Perform this exercise against resistance later as your leg gets stronger.  Leg Slides: Lying on your back, slowly slide your foot toward your buttocks, bending your knee up off the floor (only go as far as is comfortable). Then slowly slide your foot back down until your leg is flat on the floor again.  Angel Wings: Lying on your back spread your legs to the side as far apart as you can without causing discomfort.  Hamstring Strength:  Lying on your back, push your heel against the floor with your leg straight by tightening up the muscles of your buttocks.  Repeat, but this time bend your knee to a comfortable angle, and push your heel against the floor.  You may put a pillow under the heel to make it more comfortable if necessary.   A rehabilitation program following joint replacement surgery can speed recovery and prevent re-injury in the future due to weakened muscles. Contact your doctor or a physical therapist for more information on knee rehabilitation.    CONSTIPATION  Constipation is defined medically as fewer than three stools per week and severe constipation as less than one stool per week.  Even if you have a regular bowel pattern at home, your normal regimen is likely to be disrupted due to multiple reasons following surgery.  Combination of anesthesia, postoperative narcotics, change in appetite and fluid intake all can affect your bowels.   YOU MUST use at least one of the following options; they are listed in order of increasing strength to get the job done.  They are all available over the counter, and you may need to use some, POSSIBLY even all of these options:    Drink plenty of fluids (prune juice may be helpful) and high fiber foods Colace 100 mg by mouth twice a day  Senokot for constipation as directed and as needed Dulcolax (bisacodyl), take with full  glass of water  Miralax (polyethylene glycol) once or twice a day as needed.  If you have tried all these things and are unable to have a bowel movement in the first 3-4 days after surgery call either your surgeon or your primary doctor.    If you experience loose stools or diarrhea, hold the medications until you stool forms back up.  If your symptoms do not get better within 1 week or if they get worse, check with your doctor.  If you experience "the worst abdominal pain ever" or develop nausea or vomiting, please contact the office immediately for further recommendations for treatment.   ITCHING:  If you experience itching with your medications, try taking only a single pain pill, or even half a pain pill at a time.  You can also use Benadryl over the counter for itching or also to help with sleep.   TED HOSE STOCKINGS:  Use stockings on both legs until for at least 2 weeks or as directed by physician office. They may be removed at night for sleeping.  MEDICATIONS:  See your medication summary on the "After Visit Summary" that nursing will review with you.  You may have some  home medications which will be placed on hold until you complete the course of blood thinner medication.  It is important for you to complete the blood thinner medication as prescribed.  PRECAUTIONS:  If you experience chest pain or shortness of breath - call 911 immediately for transfer to the hospital emergency department.   If you develop a fever greater that 101 F, purulent drainage from wound, increased redness or drainage from wound, foul odor from the wound/dressing, or calf pain - CONTACT YOUR SURGEON.                                                   FOLLOW-UP APPOINTMENTS:  If you do not already have a post-op appointment, please call the office for an appointment to be seen by your surgeon.  Guidelines for how soon to be seen are listed in your "After Visit Summary", but are typically between 1-4 weeks after  surgery.  OTHER INSTRUCTIONS:   Knee Replacement:  Do not place pillow under knee, focus on keeping the knee straight while resting. CPM instructions: 0-90 degrees, 2 hours in the morning, 2 hours in the afternoon, and 2 hours in the evening. Place foam block, curve side up under heel at all times except when in CPM or when walking.  DO NOT modify, tear, cut, or change the foam block in any way.  MAKE SURE YOU:  Understand these instructions.  Get help right away if you are not doing well or get worse.    Thank you for letting us be a part of your medical care team.  It is a privilege we respect greatly.  We hope these instructions will help you stay on track for a fast and full recovery!   Do not put a pillow under the knee. Place it under the heel.   Complete by: As directed    Place gray foam block, curve side up under heel at all times except when in CPM or when walking.  DO NOT modify, tear, cut, or change in any way the gray foam block.   Increase activity slowly as tolerated   Complete by: As directed    Patient may shower   Complete by: As directed    Aquacel dressing is water proof    Wash over it and the whole leg with soap and water at the end of your shower   TED hose   Complete by: As directed    Use stockings (TED hose) for 2 weeks on both leg(s).  You may remove them at night for sleeping.      Follow-up Information    Elsie Saas, MD Follow up on 10/23/2018.   Specialty: Orthopedic Surgery Why: arrive at physical therapy at Dr Archie Endo office at 12:30 for appointment with Lytle Michaels at 1 pm see Dr Noemi Chapel after this appointment Contact information: [place] [id number] [phone]        [facility] Follow up.   Specialty: [facility] Contact information: [address] [place] [address] [phone]            Signed: Linda Hedges 10/10/2018, 3:02 PM

## 2019-11-22 ENCOUNTER — Encounter: Payer: Self-pay | Admitting: Gastroenterology

## 2019-12-27 ENCOUNTER — Ambulatory Visit: Payer: 59 | Admitting: Gastroenterology

## 2021-02-26 ENCOUNTER — Other Ambulatory Visit: Payer: Self-pay | Admitting: Registered Nurse

## 2021-02-26 DIAGNOSIS — E7800 Pure hypercholesterolemia, unspecified: Secondary | ICD-10-CM

## 2021-03-31 LAB — COLOGUARD: Cologuard: POSITIVE — AB

## 2021-04-13 ENCOUNTER — Other Ambulatory Visit: Payer: Self-pay | Admitting: Physician Assistant

## 2021-04-13 ENCOUNTER — Encounter: Payer: Self-pay | Admitting: Gastroenterology

## 2021-04-13 ENCOUNTER — Other Ambulatory Visit: Payer: Self-pay

## 2021-04-13 ENCOUNTER — Ambulatory Visit (HOSPITAL_COMMUNITY)
Admission: RE | Admit: 2021-04-13 | Discharge: 2021-04-13 | Disposition: A | Discharge status: Home / Self Care | Payer: Medicare PPO | Source: Ambulatory Visit | Attending: Physician Assistant | Admitting: Physician Assistant

## 2021-04-13 DIAGNOSIS — M7989 Other specified soft tissue disorders: Secondary | ICD-10-CM | POA: Insufficient documentation

## 2021-04-13 DIAGNOSIS — M79661 Pain in right lower leg: Secondary | ICD-10-CM

## 2021-04-13 NOTE — Progress Notes (Signed)
Right lower extremity venous duplex has been completed. Preliminary results can be found in CV Proc through chart review.  Results were given to [employer] PA.  04/13/21 4:55 PM Olen Cordial RVT

## 2021-05-06 ENCOUNTER — Ambulatory Visit: Payer: Medicare PPO | Admitting: Gastroenterology

## 2021-05-06 VITALS — BP 152/78 | HR 82 | Ht 67.0 in | Wt 184.0 lb

## 2021-05-06 DIAGNOSIS — R195 Other fecal abnormalities: Secondary | ICD-10-CM | POA: Diagnosis not present

## 2021-05-06 MED ORDER — PLENVU 140 G PO SOLR: 1.0000 | ORAL | 0 refills | Status: DC

## 2021-05-06 NOTE — Patient Instructions (Signed)
If you are age 66 or older, your body mass index should be between 23-30. Your Body mass index is 28.82 kg/m. If this is out of the aforementioned range listed, please consider follow up with your Primary Care Provider. ________________________________________________________  The [facility] providers would like to encourage you to use [facility] to communicate with providers for non-urgent requests or questions.  Due to long hold times on the telephone, sending your provider a message by [facility] may be a faster and more efficient way to get a response.  Please allow 48 business hours for a response.  Please remember that this is for non-urgent requests.  _______________________________________________________  [name] have been scheduled for a colonoscopy. Please follow written instructions given to you at your visit today.  Please pick up your prep supplies at the pharmacy within the next 1-3 days. If you use inhalers (even only as needed), please bring them with you on the day of your procedure.  Follow up pending the results of your Colonoscopy or as needed.  It was a pleasure to see you today!  Thank you for trusting me with your gastrointestinal care!

## 2021-05-06 NOTE — Progress Notes (Signed)
[facility] Consult Note:  History: [name] 05/06/2021  Referring provider: Holland Commons, [place]  Reason for consult/chief complaint: Colonoscopy (Patient had a positive cologuard test.)   Subjective  HPI:  This is a very pleasant 66 year old woman referred by primary care for a positive Cologuard test.  Last colonoscopy with Dr. Olevia Perches July 2014, complete exam, good prep, no polyps.  [name] denies chronic abdominal pain change in bowel habits or rectal bleeding.  Ever since her cholecystectomy 9 to 10 years ago she has had intermittent loose stool. No family history of colorectal cancer ROS:  Review of Systems She denies chest pain dyspnea or dysuria  Past Medical History: Past Medical History:  Diagnosis Date   Arthritis    Depression    GERD (gastroesophageal reflux disease)    resolved with cholecystectomy   Hypothyroidism    Pre-diabetes    Pure hypercholesterolemia    SNHL (sensorineural hearing loss)    Tubal pregnancy      Past Surgical History: Past Surgical History:  Procedure Laterality Date   CHOLECYSTECTOMY     ECTOPIC PREGNANCY SURGERY     TOTAL KNEE ARTHROPLASTY Left 10/09/2018   Procedure: TOTAL KNEE ARTHROPLASTY;  Surgeon: Elsie Saas, MD;  Location: [facility];  Service: Orthopedics;  Laterality: Left;     Family History: Family History  Problem Relation Age of Onset   Colon polyps Mother    Hypertension Mother    Hyperlipidemia Mother    Prostate cancer Father    Diabetes Father     Social History: Social History   Socioeconomic History   Marital status: Married    Spouse name: Not on file   Number of children: 2   Years of education: Master's   Highest education level: Not on file  Occupational History   Occupation: Mudlogger of the [employer]    Employer: UNEMPLOYED  Tobacco Use   Smoking status: Former   Smokeless tobacco: Never  Substance and Sexual Activity   Alcohol use: Yes     Alcohol/week: 4.0 standard drinks    Types: 4 Standard drinks or equivalent per week    Comment: usually on the weekends; 2-3 per week    Drug use: No   Sexual activity: Not on file  Other Topics Concern   Not on file  Social History Narrative   Lives with her husband.  Her adult children live independently.   Social Determinants of Health   Financial Resource Strain: Not on file  Food Insecurity: Not on file  Transportation Needs: Not on file  Physical Activity: Not on file  Stress: Not on file  Social Connections: Not on file    Allergies: Allergies  Allergen Reactions   Prednisone     Neurotic and sleep depervation   Amoxicillin-Pot Clavulanate Rash    Outpatient Meds: Current Outpatient Medications  Medication Sig Dispense Refill   Lactobacillus-Inulin (CULTURELLE ADULT ULT BALANCE PO) Take 1 tablet by mouth daily.     levothyroxine (SYNTHROID) 50 MCG tablet Take 1 tablet by mouth daily.     PEG-KCl-NaCl-NaSulf-Na Asc-C (PLENVU) 140 g SOLR Take 1 kit by mouth as directed. 1 each 0   rosuvastatin (CRESTOR) 40 MG tablet Take 1 tablet by mouth daily.     VITAMIN D-VITAMIN K PO Take 1 tablet by mouth daily.     No current facility-administered medications for this visit.      ___________________________________________________________________ Objective   Exam:  BP (!) 152/78    Pulse 82  Ht '5\' 7"'  (1.702 m)    Wt 184 lb (83.5 kg)    BMI 28.82 kg/m  Wt Readings from Last 3 Encounters:  05/06/21 184 lb (83.5 kg)  10/09/18 170 lb 3.1 oz (77.2 kg)  09/29/18 170 lb 5 oz (77.3 kg)    General: Well-appearing  CV: RRR without murmur, S1/S2, no JVD, no peripheral edema Resp: clear to auscultation bilaterally, normal RR and effort noted GI: soft, no tenderness, with active bowel sounds. No guarding or palpable organomegaly noted. Skin; warm and dry, no rash or jaundice noted Neuro: awake, alert and oriented x 3. Normal gross motor function and fluent  speech  Labs:  Positive Cologuard 03/31/21  On 02/18/2021, WBC 5.5, hemoglobin 12.4, platelet 238  Radiologic Studies:  No DVT on lower extremity ultrasound 04/13/2021  Assessment: Encounter Diagnosis  Name Primary?   Positive colorectal cancer screening using Cologuard test Yes   We discussed the possible explanations for this positive test -1 or more precancerous polyps, false positive, colorectal cancer.  Colonoscopy is indicated, which I described in detail along with risks and benefits and she was agreeable.  The benefits and risks of the planned procedure were described in detail with the patient or (when appropriate) their health care proxy.  Risks were outlined as including, but not limited to, bleeding, infection, perforation, adverse medication reaction leading to cardiac or pulmonary decompensation, pancreatitis (if ERCP).  The limitation of incomplete mucosal visualization was also discussed.  No guarantees or warranties were given.    Thank you for the courtesy of this consult.  Please call me with any questions or concerns.  Nelida Meuse III  CC: Referring provider noted above

## 2021-06-18 ENCOUNTER — Encounter: Payer: Self-pay | Admitting: Gastroenterology

## 2021-06-22 ENCOUNTER — Telehealth: Payer: Self-pay | Admitting: Gastroenterology

## 2021-06-22 NOTE — Telephone Encounter (Signed)
Spoke with patient an d advised that we received samples of Plenvu today, and that I would leave one at the front desk for her to pick up. ?

## 2021-06-22 NOTE — Telephone Encounter (Signed)
Inbound call from patient stating that she is in need of a pre authorization for plenvu. Patient is scheduled for a colon on 4/6. Please advise.  ?

## 2021-06-25 ENCOUNTER — Encounter: Payer: Medicare PPO | Admitting: Gastroenterology

## 2021-08-05 ENCOUNTER — Encounter: Payer: Medicare PPO | Admitting: Gastroenterology

## 2021-08-11 ENCOUNTER — Encounter: Payer: Self-pay | Admitting: Gastroenterology

## 2021-08-11 ENCOUNTER — Ambulatory Visit (AMBULATORY_SURGERY_CENTER): Payer: Medicare PPO | Admitting: Gastroenterology

## 2021-08-11 VITALS — BP 86/38 | HR 57 | Temp 96.2°F | Resp 12 | Ht 67.0 in | Wt 184.0 lb

## 2021-08-11 DIAGNOSIS — K648 Other hemorrhoids: Secondary | ICD-10-CM

## 2021-08-11 DIAGNOSIS — K5730 Diverticulosis of large intestine without perforation or abscess without bleeding: Secondary | ICD-10-CM | POA: Diagnosis not present

## 2021-08-11 DIAGNOSIS — R195 Other fecal abnormalities: Secondary | ICD-10-CM

## 2021-08-11 MED ORDER — SODIUM CHLORIDE 0.9 % IV SOLN: 500.0000 mL | Freq: Once | INTRAVENOUS | Status: DC

## 2021-08-11 NOTE — Patient Instructions (Signed)
YOU HAD AN ENDOSCOPIC PROCEDURE TODAY AT THE [facility]:   Refer to the procedure report that was given to you for any specific questions about what was found during the examination.  If the procedure report does not answer your questions, please call your gastroenterologist to clarify.  If you requested that your care partner not be given the details of your procedure findings, then the procedure report has been included in a sealed envelope for you to review at your convenience later.  YOU SHOULD EXPECT: Some feelings of bloating in the abdomen. Passage of more gas than usual.  Walking can help get rid of the air that was put into your GI tract during the procedure and reduce the bloating. If you had a lower endoscopy (such as a colonoscopy or flexible sigmoidoscopy) you may notice spotting of blood in your stool or on the toilet paper. If you underwent a bowel prep for your procedure, you may not have a normal bowel movement for a few days.  Please Note:  You might notice some irritation and congestion in your nose or some drainage.  This is from the oxygen used during your procedure.  There is no need for concern and it should clear up in a day or so.  SYMPTOMS TO REPORT IMMEDIATELY:   Following lower endoscopy (colonoscopy or flexible sigmoidoscopy):  Excessive amounts of blood in the stool  Significant tenderness or worsening of abdominal pains  Swelling of the abdomen that is new, acute  Fever of 100F or higher  For urgent or emergent issues, a gastroenterologist can be reached at any hour by calling [phone]. Do not use MyChart messaging for urgent concerns.    DIET:  We do recommend a small meal at first, but then you may proceed to your regular diet.  Drink plenty of fluids but you should avoid alcoholic beverages for 24 hours.  ACTIVITY:  You should plan to take it easy for the rest of today and you should NOT DRIVE or use heavy machinery until tomorrow (because  of the sedation medicines used during the test).    FOLLOW UP: Our staff will call the number listed on your records 48-72 hours following your procedure to check on you and address any questions or concerns that you may have regarding the information given to you following your procedure. If we do not reach you, we will leave a message.  We will attempt to reach you two times.  During this call, we will ask if you have developed any symptoms of COVID 19. If you develop any symptoms (ie: fever, flu-like symptoms, shortness of breath, cough etc.) before then, please call [phone].  If you test positive for Covid 19 in the 2 weeks post procedure, please call and report this information to us.    If any biopsies were taken you will be contacted by phone or by letter within the next 1-3 weeks.  Please call us at [phone] if you have not heard about the biopsies in 3 weeks.    SIGNATURES/CONFIDENTIALITY: You and/or your care partner have signed paperwork which will be entered into your electronic medical record.  These signatures attest to the fact that that the information above on your After Visit Summary has been reviewed and is understood.  Full responsibility of the confidentiality of this discharge information lies with you and/or your care-partner. 

## 2021-08-11 NOTE — Progress Notes (Signed)
VS by DT    

## 2021-08-11 NOTE — Progress Notes (Signed)
History and Physical:  This patient presents for endoscopic testing for: Encounter Diagnosis  Name Primary?   Positive colorectal cancer screening using Cologuard test Yes    Positive Cologuard test January 2023, clinical details in office consult note February 2023.  Patient without localizing GI symptoms. No polyps on last screening colonoscopy in 2014.  Patient is otherwise without complaints or active issues today.   Past Medical History: Past Medical History:  Diagnosis Date   Allergy    Anemia    Anxiety    Arthritis    Depression    GERD (gastroesophageal reflux disease)    resolved with cholecystectomy   Hypothyroidism    Pre-diabetes    Pure hypercholesterolemia    SNHL (sensorineural hearing loss)    Tubal pregnancy      Past Surgical History: Past Surgical History:  Procedure Laterality Date   CHOLECYSTECTOMY     ECTOPIC PREGNANCY SURGERY     TOTAL KNEE ARTHROPLASTY Left 10/09/2018   Procedure: TOTAL KNEE ARTHROPLASTY;  Surgeon: Elsie Saas, MD;  Location: [facility];  Service: Orthopedics;  Laterality: Left;    Allergies: Allergies  Allergen Reactions   Prednisone     Neurotic and sleep depervation   Amoxicillin-Pot Clavulanate Rash    Outpatient Meds: Current Outpatient Medications  Medication Sig Dispense Refill   rosuvastatin (CRESTOR) 40 MG tablet Take 1 tablet by mouth daily.     VITAMIN D-VITAMIN K PO Take 1 tablet by mouth daily.     Lactobacillus-Inulin (CULTURELLE ADULT ULT BALANCE PO) Take 1 tablet by mouth daily.     levothyroxine (SYNTHROID) 50 MCG tablet Take 1 tablet by mouth daily.     Current Facility-Administered Medications  Medication Dose Route Frequency Provider Last Rate Last Admin   0.9 %  sodium chloride infusion  500 mL Intravenous Once Danis, Estill Cotta III, MD          ___________________________________________________________________ Objective   Exam:  BP 136/77   Pulse 76   Temp (!) 96.2 F (35.7 C) (Skin)    Ht 5\' 7"  (1.702 m)   Wt 184 lb (83.5 kg)   SpO2 97%   BMI 28.82 kg/m   CV: RRR without murmur, S1/S2 Resp: clear to auscultation bilaterally, normal RR and effort noted GI: soft, no tenderness, with active bowel sounds.   Assessment: Encounter Diagnosis  Name Primary?   Positive colorectal cancer screening using Cologuard test Yes     Plan: Colonoscopy    The patient is appropriate for an endoscopic procedure in the ambulatory setting.   - Wilfrid Lund, MD

## 2021-08-11 NOTE — Progress Notes (Signed)
To pacu, VSS. Report to [email or web address] 

## 2021-08-11 NOTE — Op Note (Signed)
[facility] Patient Name: [name] Procedure Date: 08/11/2021 8:30 AM MRN: [id number] Endoscopist: Sherilyn Cooter L. English Neither , MD Age: 66 Referring MD:  Date of Birth: May 28, 1955 Gender: Female Account #: [id number] Procedure:                Colonoscopy Indications:              Positive Cologuard test Medicines:                Monitored Anesthesia Care Procedure:                Pre-Anesthesia Assessment:                           - Prior to the procedure, a History and Physical                            was performed, and patient medications and                            allergies were reviewed. The patient's tolerance of                            previous anesthesia was also reviewed. The risks                            and benefits of the procedure and the sedation                            options and risks were discussed with the patient.                            All questions were answered, and informed consent                            was obtained. Prior Anticoagulants: The patient has                            taken no previous anticoagulant or antiplatelet                            agents. ASA Grade Assessment: II - A patient with                            mild systemic disease. After reviewing the risks                            and benefits, the patient was deemed in                            satisfactory condition to undergo the procedure.                           After obtaining informed consent, the colonoscope  was passed under direct vision. Throughout the                            procedure, the patient's blood pressure, pulse, and                            oxygen saturations were monitored continuously. The                            CF [id number] #[id number] was introduced through the anus                            and advanced to the the cecum, identified by                            appendiceal orifice and ileocecal valve.  The                            colonoscopy was performed without difficulty. The                            patient tolerated the procedure well. The quality                            of the bowel preparation was excellent. The                            ileocecal valve, appendiceal orifice, and rectum                            were photographed. Scope In: 8:39:22 AM Scope Out: 8:56:06 AM Scope Withdrawal Time: 0 hours 13 minutes 47 seconds  Total Procedure Duration: 0 hours 16 minutes 44 seconds  Findings:                 The perianal and digital rectal examinations were                            normal.                           There is no endoscopic evidence of polyps in the                            entire colon.                           Repeat examination of right colon under NBI                            performed.                           Multiple diverticula were found in the left colon.  Internal hemorrhoids were found.                           The exam was otherwise without abnormality on                            direct and retroflexion views. Complications:            No immediate complications. Estimated Blood Loss:     Estimated blood loss: none. Impression:               - Diverticulosis in the left colon.                           - Internal hemorrhoids.                           - The examination was otherwise normal on direct                            and retroflexion views.                           - No specimens collected.                           Apparent false positive cologuard. Recommendation:           - Patient has a contact number available for                            emergencies. The signs and symptoms of potential                            delayed complications were discussed with the                            patient. Return to normal activities tomorrow.                            Written discharge instructions  were provided to the                            patient.                           - Resume previous diet.                           - Continue present medications.                           - Repeat colonoscopy in 10 years for screening                            purposes. [name] Neither, MD 08/11/2021 9:00:50 AM This report has been signed electronically.

## 2021-08-12 ENCOUNTER — Telehealth: Payer: Self-pay

## 2021-08-12 NOTE — Telephone Encounter (Signed)
  Follow up Call-     08/11/2021    7:48 AM  Call back number  Post procedure Call Back phone  # [phone]  Permission to leave phone message Yes     Patient questions:  Do you have a fever, pain , or abdominal swelling? No. Pain Score  0 *  Have you tolerated food without any problems? Yes.    Have you been able to return to your normal activities? Yes.    Do you have any questions about your discharge instructions: Diet   No. Medications  No. Follow up visit  No.  Do you have questions or concerns about your Care? No.  Actions: * If pain score is 4 or above: No action needed, pain <4.

## 2023-04-08 ENCOUNTER — Other Ambulatory Visit: Payer: Self-pay | Admitting: Obstetrics & Gynecology

## 2023-04-08 DIAGNOSIS — N644 Mastodynia: Secondary | ICD-10-CM

## 2023-04-27 ENCOUNTER — Ambulatory Visit
Admission: RE | Admit: 2023-04-27 | Discharge: 2023-04-27 | Disposition: A | Discharge status: Home / Self Care | Payer: Medicare PPO | Source: Ambulatory Visit | Attending: Obstetrics & Gynecology | Admitting: Obstetrics & Gynecology

## 2023-04-27 DIAGNOSIS — N644 Mastodynia: Secondary | ICD-10-CM
